# Patient Record
Sex: Female | Born: 1940 | Race: White | Hispanic: No | Marital: Married | Smoking: Never smoker
Health system: Southern US, Community
[De-identification: ages and names within clinical notes are randomized; demographics above are authoritative.]

## PROBLEM LIST (undated history)

## (undated) DIAGNOSIS — M069 Rheumatoid arthritis, unspecified: Secondary | ICD-10-CM

## (undated) DIAGNOSIS — Z7401 Bed confinement status: Secondary | ICD-10-CM

## (undated) HISTORY — PX: ABDOMINAL HYSTERECTOMY: SHX81

## (undated) HISTORY — PX: TONSILLECTOMY: SUR1361

## (undated) HISTORY — PX: BACK SURGERY: SHX140

## (undated) HISTORY — PX: BREAST BIOPSY: SHX20

## (undated) HISTORY — PX: JOINT REPLACEMENT: SHX530

## (undated) HISTORY — PX: OTHER SURGICAL HISTORY: SHX169

## (undated) HISTORY — PX: ORIF FEMORAL NECK FRACTURE W/ DHS: SUR930

## (undated) HISTORY — PX: DG TOES RT FOOT MULTIPLE SPECIF (ARMC HX): HXRAD1007

## (undated) HISTORY — PX: COLONOSCOPY: SHX174

## (undated) HISTORY — PX: MR FOOT LEFT (ARMC HX): HXRAD1768

## (undated) HISTORY — PX: POSTERIOR LAMINECTOMY / DECOMPRESSION CERVICAL SPINE: SUR739

---

## 2003-07-28 ENCOUNTER — Other Ambulatory Visit: Payer: Self-pay

## 2003-12-01 ENCOUNTER — Other Ambulatory Visit: Payer: Self-pay

## 2003-12-23 ENCOUNTER — Inpatient Hospital Stay: Payer: Self-pay | Admitting: General Practice

## 2003-12-26 ENCOUNTER — Encounter: Payer: Self-pay | Admitting: Internal Medicine

## 2003-12-30 ENCOUNTER — Encounter: Payer: Self-pay | Admitting: Internal Medicine

## 2004-01-29 ENCOUNTER — Encounter: Payer: Self-pay | Admitting: Internal Medicine

## 2004-02-29 ENCOUNTER — Encounter: Payer: Self-pay | Admitting: Internal Medicine

## 2004-03-31 ENCOUNTER — Encounter: Payer: Self-pay | Admitting: Internal Medicine

## 2004-04-28 ENCOUNTER — Encounter: Payer: Self-pay | Admitting: Internal Medicine

## 2004-05-29 ENCOUNTER — Encounter: Payer: Self-pay | Admitting: Internal Medicine

## 2004-06-28 ENCOUNTER — Encounter: Payer: Self-pay | Admitting: Internal Medicine

## 2004-07-29 ENCOUNTER — Encounter: Payer: Self-pay | Admitting: Internal Medicine

## 2004-08-28 ENCOUNTER — Encounter: Payer: Self-pay | Admitting: Internal Medicine

## 2004-09-28 ENCOUNTER — Encounter: Payer: Self-pay | Admitting: Internal Medicine

## 2004-10-29 ENCOUNTER — Encounter: Payer: Self-pay | Admitting: Internal Medicine

## 2004-11-18 ENCOUNTER — Ambulatory Visit: Payer: Self-pay | Admitting: General Surgery

## 2004-11-24 ENCOUNTER — Ambulatory Visit: Payer: Self-pay | Admitting: Gastroenterology

## 2004-11-28 ENCOUNTER — Encounter: Payer: Self-pay | Admitting: Internal Medicine

## 2004-12-29 ENCOUNTER — Encounter: Payer: Self-pay | Admitting: Internal Medicine

## 2005-01-04 ENCOUNTER — Other Ambulatory Visit: Payer: Self-pay

## 2005-01-11 ENCOUNTER — Inpatient Hospital Stay: Payer: Self-pay | Admitting: General Practice

## 2005-01-28 ENCOUNTER — Encounter: Payer: Self-pay | Admitting: Internal Medicine

## 2005-02-28 ENCOUNTER — Encounter: Payer: Self-pay | Admitting: Internal Medicine

## 2005-03-31 ENCOUNTER — Encounter: Payer: Self-pay | Admitting: Internal Medicine

## 2005-04-28 ENCOUNTER — Encounter: Payer: Self-pay | Admitting: Internal Medicine

## 2005-05-29 ENCOUNTER — Encounter: Payer: Self-pay | Admitting: Internal Medicine

## 2005-06-28 ENCOUNTER — Encounter: Payer: Self-pay | Admitting: Internal Medicine

## 2005-07-29 ENCOUNTER — Encounter: Payer: Self-pay | Admitting: Internal Medicine

## 2005-08-28 ENCOUNTER — Encounter: Payer: Self-pay | Admitting: Internal Medicine

## 2005-09-28 ENCOUNTER — Encounter: Payer: Self-pay | Admitting: Internal Medicine

## 2005-10-29 ENCOUNTER — Encounter: Payer: Self-pay | Admitting: Internal Medicine

## 2005-11-28 ENCOUNTER — Encounter: Payer: Self-pay | Admitting: Internal Medicine

## 2005-11-29 ENCOUNTER — Ambulatory Visit: Payer: Self-pay | Admitting: General Surgery

## 2005-12-29 ENCOUNTER — Encounter: Payer: Self-pay | Admitting: Internal Medicine

## 2006-05-10 ENCOUNTER — Ambulatory Visit: Payer: Self-pay | Admitting: Podiatry

## 2007-01-02 ENCOUNTER — Ambulatory Visit: Payer: Self-pay | Admitting: General Surgery

## 2007-03-09 ENCOUNTER — Ambulatory Visit: Payer: Self-pay | Admitting: Podiatry

## 2008-02-01 ENCOUNTER — Ambulatory Visit: Payer: Self-pay | Admitting: General Surgery

## 2009-02-02 ENCOUNTER — Ambulatory Visit: Payer: Self-pay | Admitting: Internal Medicine

## 2009-12-02 ENCOUNTER — Ambulatory Visit: Payer: Self-pay | Admitting: Gastroenterology

## 2010-02-03 ENCOUNTER — Ambulatory Visit: Payer: Self-pay | Admitting: Internal Medicine

## 2011-03-29 ENCOUNTER — Ambulatory Visit: Payer: Self-pay | Admitting: Internal Medicine

## 2011-05-31 ENCOUNTER — Encounter: Payer: Self-pay | Admitting: Rheumatology

## 2011-09-02 ENCOUNTER — Other Ambulatory Visit: Payer: Self-pay | Admitting: Rheumatology

## 2011-09-02 LAB — SYNOVIAL CELL COUNT + DIFF, W/ CRYSTALS
Crystals, Joint Fluid: NONE SEEN
Eosinophil: 0 %
Lymphocytes: 11 %
Neutrophils: 53 %
Nucleated Cell Count: 661 /mm3

## 2011-09-06 LAB — BODY FLUID CULTURE

## 2011-10-11 ENCOUNTER — Ambulatory Visit: Payer: Self-pay | Admitting: General Practice

## 2011-10-11 DIAGNOSIS — Z01810 Encounter for preprocedural cardiovascular examination: Secondary | ICD-10-CM

## 2011-10-11 LAB — CBC
HGB: 12.4 g/dL (ref 12.0–16.0)
MCH: 27.8 pg (ref 26.0–34.0)
MCHC: 32.8 g/dL (ref 32.0–36.0)
Platelet: 267 10*3/uL (ref 150–440)
RDW: 15.2 % — ABNORMAL HIGH (ref 11.5–14.5)

## 2011-10-11 LAB — URINALYSIS, COMPLETE
Bilirubin,UR: NEGATIVE
Glucose,UR: NEGATIVE mg/dL (ref 0–75)
Ketone: NEGATIVE
Leukocyte Esterase: NEGATIVE
Specific Gravity: 1.011 (ref 1.003–1.030)
Squamous Epithelial: <1
WBC UR: NONE SEEN /HPF (ref 0–5)

## 2011-10-11 LAB — APTT: Activated PTT: 27.7 secs (ref 23.6–35.9)

## 2011-10-11 LAB — BASIC METABOLIC PANEL
Anion Gap: 7 (ref 7–16)
BUN: 20 mg/dL — ABNORMAL HIGH (ref 7–18)
Calcium, Total: 9.1 mg/dL (ref 8.5–10.1)
Creatinine: 0.84 mg/dL (ref 0.60–1.30)
EGFR (African American): >60
EGFR (Non-African Amer.): >60
Glucose: 89 mg/dL (ref 65–99)
Osmolality: 278 (ref 275–301)
Sodium: 138 mmol/L (ref 136–145)

## 2011-10-11 LAB — MRSA PCR SCREENING

## 2011-10-11 LAB — PROTIME-INR: Prothrombin Time: 11.9 secs (ref 11.5–14.7)

## 2011-10-24 ENCOUNTER — Inpatient Hospital Stay: Payer: Self-pay | Admitting: General Practice

## 2011-10-25 LAB — BASIC METABOLIC PANEL
Anion Gap: 7 (ref 7–16)
BUN: 10 mg/dL (ref 7–18)
Co2: 25 mmol/L (ref 21–32)
Creatinine: 0.6 mg/dL (ref 0.60–1.30)
EGFR (African American): >60
EGFR (Non-African Amer.): >60
Osmolality: 276 (ref 275–301)

## 2011-10-25 LAB — HEMOGLOBIN: HGB: 8.2 g/dL — ABNORMAL LOW (ref 12.0–16.0)

## 2011-10-26 LAB — BASIC METABOLIC PANEL
Anion Gap: 7 (ref 7–16)
BUN: 8 mg/dL (ref 7–18)
Calcium, Total: 7.7 mg/dL — ABNORMAL LOW (ref 8.5–10.1)
Chloride: 109 mmol/L — ABNORMAL HIGH (ref 98–107)
Co2: 25 mmol/L (ref 21–32)
EGFR (African American): >60
Osmolality: 279 (ref 275–301)
Potassium: 3.7 mmol/L (ref 3.5–5.1)

## 2011-10-26 LAB — HEMOGLOBIN: HGB: 7.9 g/dL — ABNORMAL LOW (ref 12.0–16.0)

## 2011-10-26 LAB — PLATELET COUNT: Platelet: 153 10*3/uL (ref 150–440)

## 2011-10-27 LAB — HEMOGLOBIN: HGB: 9.7 g/dL — ABNORMAL LOW (ref 12.0–16.0)

## 2011-10-28 LAB — PATHOLOGY REPORT

## 2012-03-29 ENCOUNTER — Ambulatory Visit: Payer: Self-pay | Admitting: Internal Medicine

## 2013-04-01 ENCOUNTER — Ambulatory Visit: Payer: Self-pay | Admitting: Internal Medicine

## 2013-06-21 ENCOUNTER — Ambulatory Visit: Payer: Self-pay | Admitting: Rheumatology

## 2013-07-01 ENCOUNTER — Encounter: Payer: Self-pay | Admitting: Rheumatology

## 2013-08-16 ENCOUNTER — Ambulatory Visit: Payer: Self-pay | Admitting: Orthopedic Surgery

## 2013-10-21 ENCOUNTER — Encounter: Payer: Self-pay | Admitting: Orthopedic Surgery

## 2013-10-29 ENCOUNTER — Encounter: Payer: Self-pay | Admitting: Orthopedic Surgery

## 2013-11-28 ENCOUNTER — Encounter: Payer: Self-pay | Admitting: Orthopedic Surgery

## 2014-04-03 ENCOUNTER — Ambulatory Visit: Payer: Self-pay | Admitting: Internal Medicine

## 2014-06-17 NOTE — Op Note (Signed)
PATIENT NAME:  Lindsay Arnold, Lindsay Arnold MR#:  [id number] DATE OF BIRTH:  [birth year]  DATE OF PROCEDURE:  10/24/2011  PREOPERATIVE DIAGNOSES:  1. 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  2. Retained hardware of the right hip status post open reduction and internal fixation of right femoral neck fracture.   POSTOPERATIVE DIAGNOSES:  1. 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  2. Retained hardware of the right hip status post open reduction and internal fixation of right femoral neck fracture.   PROCEDURES PERFORMED:  1. Removal of retained hardware from the right hip.  2. Right total hip arthroplasty.   SURGEON: Illene Labrador. Hooten, M.D.   ASSISTANT: Van Clines, PA-C (required to maintain retraction throughout the procedure).   ANESTHESIA: General.   ESTIMATED BLOOD LOSS: 300 mL.  FLUIDS REPLACED: 21,000 mL of crystalloid.  DRAINS: Two medium drains to Hemovac reservoir.   IMPLANTS UTILIZED: DePuy 15 mm small stature Prodigy femoral component, 54 mm outer diameter Pinnacle Sector two acetabular component, two 6.5 mm cancellus bone screws, a +4 mm 10 degree Pinnacle Marathon polyethylene liner, and a -2 mm M-SPEC femoral head with a -2 mm neck length.   INDICATIONS FOR SURGERY: The patient is a 74 year old female with a long history of rheumatoid arthritis who underwent open reduction and internal fixation of right femoral neck fracture several years ago as per Dr. Reita Chard. She had been seen recently with complaints of progressive right groin pain as well as noticeable limb length difference. 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 After a discussion of the risks and benefits of surgical intervention, the patient expressed her understanding of the risks and benefits and agreed with plans for surgical  intervention.   PROCEDURE IN DETAIL: The patient was brought to the Operating Room and, after adequate general anesthesia was achieved, the patient was placed in the left lateral decubitus position. Axillary roll was placed and all bony prominences were well padded. The patient's right hip and leg were cleaned and prepped with alcohol and DuraPrep and draped in the usual sterile fashion. A "time-out" was performed as per usual protocol. A lateral curvilinear incision was made gently curving towards the posterior superior iliac spine. IT band was incised in line with the skin incision and the fibers of the gluteus maximus were split in line. A large bursa was encountered and was incised and drained. Two 7.3 mm cannulated partially threaded cancellus screws were noted and these were removed without difficulty. Below the screws was the compression screw and the sideplate which was visualized. The two 4.5 mm cortical screws from the sideplate were removed and the side plate was removed without difficulty. The compression screw was then engaged with a T handle and was removed without difficulty. The bursa was debrided using electrocautery. The vastus lateralis was repaired using #5 Ethibond. Next, the piriformis tendon was identified, skeletonized, and incised at its insertion to the proximal femur and reflected posteriorly. In a similar fashion, short external rotators were incised and reflected posteriorly. A T-type posterior capsulotomy was performed. The femoral head was then dislocated posteriorly. Severe avascular changes were noted with collapse of the femoral head. The femoral neck cut was performed using an oscillating saw and the anterior capsule was elevated off the femoral neck. Inspection of the acetabulum demonstrated severe degenerative changes. Remnant of the labrum was excised. The acetabulum was reamed in a  sequential fashion up to a 53 mm diameter. Good punctate bleeding bone was encountered. A 54 mm  outer diameter Pinnacle Sector 2 acetabular component was positioned and impacted into place. Good scratch fit was appreciated. Two 6.5 mm cancellus screws were inserted through the shell for additional fixation. A +4 mm neutral polyethylene trial was inserted and attention was directed to the proximal femur. Pilot hole for reaming of the proximal femoral canal was created using a high-speed bur. Proximal femoral canal was reamed in a sequential fashion up to a 14.5 mm diameter. Proximal femur was prepared using a 50 mm aggressive side-biting reamer. Serial broaches were inserted up to a 15 mm small stature broach. The calcar region was planed accordingly and trial reduction was performed using first a 1.5 and subsequently a -2 mm hip ball. The hip was felt to be tight. The 15 mm small stature broach was countersunk and additional bone was removed from the calcar region using calcar planer. Trial reduction was again performed using first an AML neck length with +4 mm neutral. Subsequently, a +4 mm 10 degree trial liner was placed with the high side at approximately the eight o'clock position. Improved offset was appreciated with Prodigy neck segment. Excellent stability was appreciated both anteriorly and posteriorly. Good equalization of limb lengths was noted. Trial components were removed. The acetabular shell was irrigated with copious amounts of normal saline with antibiotic solution. A +4 mm 10 degree Pinnacle Marathon polyethylene liner was positioned with the high side at eight o'clock position and impacted in place. Next, a 15 mm small stature Prodigy femoral component was positioned and impacted into place. Excellent scratch fit was appreciated. Trial reduction was again performed with the -2 mm neck length. Excellent stability was appreciated both anteriorly and posteriorly. Trial hip ball was removed. The Morse taper was cleaned and dried. A 36 mm M-SPEC femoral component with a -2 mm neck length was  placed on the trunnion and impacted into place. The hip was reduced and placed through a range of motion. Excellent stability was appreciated and good equalization of limb lengths noted. There did not appear to be undue tension on the sciatic nerve. The wound was irrigated with copious amounts of normal saline with antibiotic solution using pulsatile lavage and then suctioned dry. Good hemostasis was appreciated. The posterior capsulotomy was repaired using #5 Ethibond. The piriformis tendon was reapproximated on the undersurface of the gluteus medius tendon using #5 Ethibond. Two medium drains were placed in the wound bed and brought out through a separate stab incision to be attached to a Hemovac reservoir. IT band was repaired using interrupted sutures of #1 Vicryl. The subcutaneous tissue was approximated in layers using first #0 Vicryl followed 2-0 Vicryl. Skin was closed with skin staples. A sterile dressing was applied. The patient tolerated the procedure well. She was transported to the recovery room in stable condition.    ____________________________ Illene LabradorJames P. Angie FavaHooten Jr., MD jph:ap D: 10/24/2011 16:24:37 ET T: 10/24/2011 17:26:31 ET JOB#: [id number]  cc: Illene LabradorJames P. Angie FavaHooten Jr., MD, <Dictator> JAMES P Angie FavaHOOTEN JR MD ELECTRONICALLY SIGNED 10/25/2011 7:15

## 2014-06-17 NOTE — Discharge Summary (Signed)
PATIENT NAME:  Lindsay Arnold, Lindsay Arnold MR#:  [id number] DATE OF BIRTH:  [birth year]  DATE OF ADMISSION:  10/24/2011 DATE OF DISCHARGE:  10/27/2011  ADMITTING DIAGNOSIS: Posttraumatic degenerative arthrosis of right hip superimposed on rheumatoid arthritis.   DISCHARGE DIAGNOSES:  1. 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 2. Retained hardware of right hip status post open reduction internal fixation of right femoral neck fracture.   PROCEDURES:  1. Removal of retained hardware from the right hip.  2. Right total hip arthroplasty.   SURGEON: Illene Labrador. Hooten, MD   ASSISTANT: Hamilton Capri PA-C    ANESTHESIA: General.   ESTIMATED BLOOD LOSS: 300 mL.   FLUIDS REPLACED: 2100 mL of Crystalloid.   DRAINS: Two medium drains to Hemovac reservoir.  IMPLANTS UTILIZED: DePuy 15 mm small stature Prodigy femoral component, 54 mm outer diameter Pinnacle sector II acetabular component, two 6.5 mm cancellous bone screws, a +4 mm 10 degree Pinnacle Marathon polyethylene liner, and a -2 mm M-Spec femoral head with a 2 mm neck length.   HISTORY: The patient is 74 year old female with a long history of rheumatoid arthritis who underwent open reduction internal fixation of the right femoral neck fracture several years ago as per Dr. Myra Rude. She had been seen recently with complaints of progressive right groin pain as well as noticeable limb length difference. 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 After discussion of the risks and benefits of surgical intervention, the patient expressed her understanding of the risks and benefits and agreed with plans for surgical intervention.   PHYSICAL EXAMINATION: GENERAL: Well developed, well nourished female seen in no acute distress. Antalgic gait, abductor lurch. HEENT: Atraumatic, normocephalic. Pupils equal and reactive  to light. Extraocular motions intact. Sclerae clear. Oropharynx is clear. Moist mucosa. NECK: Supple, nontender with good range of motion. No thyromegaly. No adenopathy. No JVD. No carotid bruits. LUNGS: Clear to auscultation bilaterally. CARDIOVASCULAR: Regular rate and rhythm. Normal S1, S2. No murmur. No appreciable gallops or rubs. Peripheral pulses are palpable. No lower extremity edema. Homans test is negative. ABDOMEN: Soft, nontender, nondistended. Bowel sounds are present. SPINE: Fair range of motion with both flexion and extension. Sacroiliac joints are nontender. No paraspinous tenderness. Flip test is negative. EXTREMITIES: Deformities and nodules to the hands consistent with rheumatoid disease. RIGHT HIP: Positive for pelvic tilt and significant shortening of the right lower extremity. She has crepitus range of motion of the hip. She is tender along the greater trochanter and pain elicited by axial compression or extreme rotation. Atrophy negative. She does have weak hip flexion. NEUROLOGIC: Awake, alert, and oriented. Sensory function is intact to pin prick and light touch. Motor strength is judged to be 5 out of 5 except as noted above. Motor coordination is grossly within normal limits. No apparent clonus. No tremor.   HOSPITAL COURSE: The patient was admitted to the hospital 10/24/2011. She had surgery that same day and was brought to the orthopedic floor from the PAC-U unit in stable condition. On August 27th hemoglobin dropped to 8.2 and then on postop day two on August 28th it dropped to 7.9. On 10/26/2011 the patient was given 1 unit of packed red blood cells. She had progressed well with physical therapy. On postop day three on 10/27/2011 hemoglobin was stable at 9.7. The patient's vital signs were stable and the patient had progressed well with physical therapy. The patient was ready for  discharge home with home health.   DISCHARGE INSTRUCTIONS:  1. Right total hip replacement. The  patient may gradually increase weightbearing on the affected extremity. Knee-high TED hose on both legs and remove at bedtime. Replace when arising the next morning.  2. Diet. She may resume a regular diet as tolerated.  3. Medications. Her medications consist of Lovenox 40 mg subcutaneous once a day, pain meds with Ultram 1 to 2 tablets every six hours needed for pain and oxycodone 5 to 10 mg every four hours as needed for pain.  4. Wound care. Apply an ice pack to the affected area. Do not get the dressing or bandage wet or dirty. [facility] office if the dressing gets water under it. Leave the dressing on.  5. Symptoms to report. [facility] if any of the following occur: Bright red bleeding from the incision or wound, fever above 101.5 degrees, redness, swelling, or drainage at the incision. Call [facility] if you experience increased leg pain, numbness or weakness in the legs or bowel or bladder symptoms.  6. Referral. Home physical therapy has been arranged for continuation of rehab program. Call [facility] if a therapist has not contacted you within 48 hours.  7. Follow-up. Follow-up appointment 11/29/2011 at 11 a.m.   DISCHARGE MEDICATIONS:  1. Lutein capsule 6 mg capsule 1 capsule orally once a day at lunch.  2. Ascorbic acid tablet 500 mg 1 tablet oral b.i.d.  3. Multivitamin capsule multiple vitamins one cap orally once a day at lunch. 4. Levo-T 50 mcg 0.05 mg oral tablet one orally once a day in the morning.  5. Calcium 600 mg Plus D 1 tablet oral b.i.d. with lunch and dinner. 6. Tylenol 500 mg 2 tabs orally p.r.n. 7. GenTeal one drop to each affected eye p.r.n.  8. Leflunomide 20 mg oral tablet 1 tablet orally once a day in the morning.  9. Clindamycin 300 mg two capsules one hour prior to dental appointment. 10. Prilosec OTC 20 mg oral delayed release tablet 1 tablet orally once a day in the morning.  11. Sudafed 30 mg oral tablet 1 tablet orally  every six hours p.r.n.   ____________________________ Evon Slackhomas C. Guy Seese, PA-C tcg:drc D: 11/07/2011 14:22:59 ET T: 11/08/2011 12:15:35 ET JOB#: [id number]  cc: Evon Slackhomas C. Johnmark Geiger, PA-C, <Dictator> Evon SlackHOMAS C Jacklyn Branan PA ELECTRONICALLY SIGNED 11/29/2011 13:48

## 2015-03-18 ENCOUNTER — Other Ambulatory Visit: Payer: Self-pay | Admitting: Internal Medicine

## 2015-03-18 DIAGNOSIS — Z1231 Encounter for screening mammogram for malignant neoplasm of breast: Secondary | ICD-10-CM

## 2015-04-06 ENCOUNTER — Other Ambulatory Visit: Payer: Self-pay | Admitting: Internal Medicine

## 2015-04-06 ENCOUNTER — Ambulatory Visit
Admission: RE | Admit: 2015-04-06 | Discharge: 2015-04-06 | Disposition: A | Discharge status: Home / Self Care | Payer: Medicare Other | Source: Ambulatory Visit | Attending: Internal Medicine | Admitting: Internal Medicine

## 2015-04-06 DIAGNOSIS — Z1231 Encounter for screening mammogram for malignant neoplasm of breast: Secondary | ICD-10-CM

## 2015-04-22 ENCOUNTER — Encounter: Payer: Self-pay | Admitting: *Deleted

## 2015-04-23 ENCOUNTER — Ambulatory Visit: Payer: Medicare Other | Admitting: Anesthesiology

## 2015-04-23 ENCOUNTER — Encounter
Admission: RE | Disposition: A | Discharge status: Home / Self Care | Payer: Self-pay | Source: Ambulatory Visit | Attending: Gastroenterology

## 2015-04-23 ENCOUNTER — Ambulatory Visit
Admission: RE | Admit: 2015-04-23 | Discharge: 2015-04-23 | Disposition: A | Discharge status: Home / Self Care | Payer: Medicare Other | Source: Ambulatory Visit | Attending: Gastroenterology | Admitting: Gastroenterology

## 2015-04-23 DIAGNOSIS — Z8371 Family history of colonic polyps: Secondary | ICD-10-CM | POA: Diagnosis not present

## 2015-04-23 DIAGNOSIS — Z1211 Encounter for screening for malignant neoplasm of colon: Secondary | ICD-10-CM | POA: Diagnosis not present

## 2015-04-23 DIAGNOSIS — K5730 Diverticulosis of large intestine without perforation or abscess without bleeding: Secondary | ICD-10-CM | POA: Insufficient documentation

## 2015-04-23 DIAGNOSIS — Z79899 Other long term (current) drug therapy: Secondary | ICD-10-CM | POA: Insufficient documentation

## 2015-04-23 DIAGNOSIS — Z7982 Long term (current) use of aspirin: Secondary | ICD-10-CM | POA: Insufficient documentation

## 2015-04-23 HISTORY — PX: COLONOSCOPY WITH PROPOFOL: SHX5780

## 2015-04-23 SURGERY — COLONOSCOPY WITH PROPOFOL
Anesthesia: General

## 2015-04-23 MED ORDER — PROPOFOL 10 MG/ML IV BOLUS
INTRAVENOUS | Status: DC | PRN
  Administered 2015-04-23: 50 mg via INTRAVENOUS

## 2015-04-23 MED ORDER — SODIUM CHLORIDE 0.9 % IV SOLN
INTRAVENOUS | Status: DC
  Administered 2015-04-23: 11:00:00 via INTRAVENOUS

## 2015-04-23 MED ORDER — SODIUM CHLORIDE 0.9 % IV SOLN: INTRAVENOUS | Status: DC

## 2015-04-23 MED ORDER — PROPOFOL 500 MG/50ML IV EMUL
INTRAVENOUS | Status: DC | PRN
  Administered 2015-04-23: 100 ug/kg/min via INTRAVENOUS

## 2015-04-23 NOTE — Op Note (Signed)
[facility] Patient Name: Lindsay Arnold Procedure Date: 04/23/2015 10:46 AM MRN: [id number] Account #: [id number] Date of Birth: 07/12/1940 Admit Type: Outpatient Age: 75 Room: [facility] ENDO ROOM 4 Gender: Female Note Status: Finalized Procedure:            Colonoscopy Indications:          Colon cancer screening in patient at increased risk:                        Family history of 1st-degree relative with colon polyps Providers:            Ezzard Standing. Bluford Kaufmann, MD Referring MD:         Daniel Nones, MD (Referring MD) Medicines:            Monitored Anesthesia Care Complications:        No immediate complications. Procedure:            Pre-Anesthesia Assessment:                       - Prior to the procedure, a History and Physical was                        performed, and patient medications, allergies and                        sensitivities were reviewed. The patient's tolerance of                        previous anesthesia was reviewed.                       - The risks and benefits of the procedure and the                        sedation options and risks were discussed with the                        patient. All questions were answered and informed                        consent was obtained.                       - After reviewing the risks and benefits, the patient                        was deemed in satisfactory condition to undergo the                        procedure.                       After obtaining informed consent, the colonoscope was                        passed under direct vision. Throughout the procedure,                        the patient's blood pressure, pulse, and oxygen  saturations were monitored continuously. The                        Colonoscope was introduced through the anus and                        advanced to the the cecum, identified by appendiceal                        orifice and ileocecal valve.  The colonoscopy was                        performed without difficulty. The patient tolerated the                        procedure well. The quality of the bowel preparation                        was fair. Findings:      Multiple small and large-mouthed diverticula were found in the sigmoid       colon.      The exam was otherwise without abnormality. Impression:           - Preparation of the colon was fair.                       - Diverticulosis in the sigmoid colon.                       - The examination was otherwise normal.                       - No specimens collected. Recommendation:       - Discharge patient to home.                       - The findings and recommendations were discussed with                        the patient. Procedure Code(s):    --- Professional ---                       [phone], Colonoscopy, flexible; diagnostic, including                        collection of specimen(s) by brushing or washing, when                        performed (separate procedure) Diagnosis Code(s):    --- Professional ---                       Z83.71, Family history of colonic polyps                       K57.30, Diverticulosis of large intestine without                        perforation or abscess without bleeding CPT copyright 2016 American [employer] Association. All rights reserved. The codes documented in this report are preliminary and upon coder review may  be revised to meet current compliance requirements. Ezzard Standing  Bluford Kaufmann, MD 04/23/2015 11:02:56 AM This report has been signed electronically. Number of Addenda: 0 Note Initiated On: 04/23/2015 10:46 AM Scope Withdrawal Time: 0 hours 5 minutes 12 seconds  Total Procedure Duration: 0 hours 9 minutes 27 seconds       [facility]

## 2015-04-23 NOTE — Transfer of Care (Signed)
Immediate Anesthesia Transfer of Care Note  Patient: Lindsay Arnold  Procedure(s) Performed: Procedure(s): COLONOSCOPY WITH PROPOFOL (N/A)  Patient Location: PACU  Anesthesia Type:General  Level of Consciousness: awake, alert  and oriented  Airway & Oxygen Therapy: Patient Spontanous Breathing and Patient connected to nasal cannula oxygen  Post-op Assessment: Report given to RN and Post -op Vital signs reviewed and stable  Post vital signs: Reviewed and stable  Last Vitals:  Filed Vitals:   04/23/15 1023  BP: 159/86  Pulse: 112  Temp: 36 C  Resp: 18    Complications: No apparent anesthesia complications

## 2015-04-23 NOTE — H&P (Signed)
Primary Care Physician:  Lynnea Ferrier, MD Primary Gastroenterologist:  Dr. Bluford Kaufmann  Pre-Procedure History & Physical: HPI:  Lindsay Arnold is a 75 y.o. female is here for an colonoscopy   No past medical history on file.  Past Surgical History  Procedure Laterality Date  . Breast biopsy Left   . Back surgery    . Abdominal hysterectomy    . Mr foot left ([facility] hx)    . Dg toes rt foot multiple specif ([facility] hx) Right     reconstruction  . Tonsillectomy    . Joint replacement    . Ltk rtk rth  Bilateral   . Posterior laminectomy / decompression cervical spine    . Orif femoral neck fracture w/ dhs    . Colonoscopy      Prior to Admission medications   Medication Sig Start Date End Date Taking? Authorizing Provider  acetaminophen (TYLENOL) 325 MG tablet Take 650 mg by mouth every 6 (six) hours as needed.   Yes Historical Provider, MD  aspirin 81 MG tablet Take 81 mg by mouth daily.   Yes Historical Provider, MD  azelastine (ASTELIN) 0.1 % nasal spray Place into both nostrils 2 (two) times daily. Use in each nostril as directed   Yes Historical Provider, MD  calcium-vitamin D (OSCAL WITH D) 500-200 MG-UNIT tablet Take 1 tablet by mouth.   Yes Historical Provider, MD  Clindamycin HCl (CLINDAMYCIN MONOHYDRATE) powder Apply topically.   Yes Historical Provider, MD  hydroxypropyl methylcellulose / hypromellose (ISOPTO TEARS / GONIOVISC) 2.5 % ophthalmic solution 1 drop.   Yes Historical Provider, MD  ipratropium-albuterol (DUONEB) 0.5-2.5 (3) MG/3ML SOLN Take 3 mLs by nebulization every 6 (six) hours as needed.   Yes Historical Provider, MD  leflunomide (ARAVA) 10 MG tablet Take 10 mg by mouth daily.   Yes Historical Provider, MD  levothyroxine (SYNTHROID, LEVOTHROID) 50 MCG tablet Take 50 mcg by mouth daily before breakfast.   Yes Historical Provider, MD  meloxicam (MOBIC) 7.5 MG tablet Take 7.5 mg by mouth daily.   Yes Historical Provider, MD  multivitamin-lutein (OCUVITE-LUTEIN)  CAPS capsule Take 1 capsule by mouth daily.   Yes Historical Provider, MD  omeprazole (PRILOSEC) 10 MG capsule Take 10 mg by mouth daily.   Yes Historical Provider, MD  vitamin C (ASCORBIC ACID) 500 MG tablet Take 500 mg by mouth daily.   Yes Historical Provider, MD  Abatacept (ORENCIA) 125 MG/ML SOSY Inject into the skin. Reported on 04/23/2015    Historical Provider, MD    Allergies as of 04/17/2015  . (Not on File)    Family History  Problem Relation Age of Onset  . Breast cancer Paternal Grandmother     Social History   Social History  . Marital Status: Married    Spouse Name: N/A  . Number of Children: N/A  . Years of Education: N/A   Occupational History  . Not on file.   Social History Main Topics  . Smoking status: Not on file  . Smokeless tobacco: Not on file  . Alcohol Use: Not on file  . Drug Use: Not on file  . Sexual Activity: Not on file   Other Topics Concern  . Not on file   Social History Narrative    Review of Systems: See HPI, otherwise negative ROS  Physical Exam: BP 159/86 mmHg  Pulse 112  Temp(Src) 96.8 F (36 C) (Tympanic)  Resp 18  Ht  (1.651 m)  Wt  66.679 kg (147 lb)  BMI 24.46 kg/m2  SpO2 100% General:   Alert,  pleasant and cooperative in NAD Head:  Normocephalic and atraumatic. Neck:  Supple; no masses or thyromegaly. Lungs:  Clear throughout to auscultation.    Heart:  Regular rate and rhythm. Abdomen:  Soft, nontender and nondistended. Normal bowel sounds, without guarding, and without rebound.   Neurologic:  Alert and  oriented x4;  grossly normal neurologically.  Impression/Plan: Lindsay Arnold is here for an colonoscopy to be performed for screening, family hx of colon polyps  Risks, benefits, limitations, and alternatives regarding  colonoscopy} have been reviewed with the patient.  Questions have been answered.  All parties agreeable.   Lance [place], Ezzard Standing, MD  04/23/2015, 10:45 AM

## 2015-04-23 NOTE — Anesthesia Postprocedure Evaluation (Signed)
Anesthesia Post Note  Patient: Lindsay Arnold  Procedure(s) Performed: Procedure(s) (LRB): COLONOSCOPY WITH PROPOFOL (N/A)  Patient location during evaluation: PACU Anesthesia Type: General Level of consciousness: awake Pain management: pain level controlled Vital Signs Assessment: post-procedure vital signs reviewed and stable Respiratory status: spontaneous breathing Cardiovascular status: stable Anesthetic complications: no    Last Vitals:  Filed Vitals:   04/23/15 1110 04/23/15 1120  BP: 111/62 138/71  Pulse:  77  Temp:    Resp:  16    Last Pain: There were no vitals filed for this visit.               VAN STAVEREN,Jolynne Spurgin

## 2015-04-23 NOTE — Anesthesia Preprocedure Evaluation (Signed)
Anesthesia Evaluation  Patient identified by MRN, date of birth, ID band Patient awake    Reviewed: Allergy & Precautions, NPO status , Patient's Chart, lab work & pertinent test results  History of Anesthesia Complications Negative for: history of anesthetic complications  Airway Mallampati: II       Dental no notable dental hx.    Pulmonary neg pulmonary ROS,    Pulmonary exam normal        Cardiovascular Exercise Tolerance: Good  Rhythm:Regular Rate:Normal     Neuro/Psych negative neurological ROS     GI/Hepatic negative GI ROS, Neg liver ROS,   Endo/Other  negative endocrine ROS  Renal/GU negative Renal ROS     Musculoskeletal   Abdominal   Peds negative pediatric ROS (+)  Hematology negative hematology ROS (+)   Anesthesia Other Findings   Reproductive/Obstetrics                             Anesthesia Physical Anesthesia Plan  ASA: II  Anesthesia Plan: General   Post-op Pain Management:    Induction: Intravenous  Airway Management Planned: Natural Airway and Nasal Cannula  Additional Equipment:   Intra-op Plan:   Post-operative Plan:   Informed Consent: I have reviewed the patients History and Physical, chart, labs and discussed the procedure including the risks, benefits and alternatives for the proposed anesthesia with the patient or authorized representative who has indicated his/her understanding and acceptance.     Plan Discussed with: CRNA  Anesthesia Plan Comments:         Anesthesia Quick Evaluation

## 2015-04-28 ENCOUNTER — Encounter: Payer: Self-pay | Admitting: Gastroenterology

## 2016-03-25 ENCOUNTER — Other Ambulatory Visit: Payer: Self-pay | Admitting: Internal Medicine

## 2016-03-25 DIAGNOSIS — Z1231 Encounter for screening mammogram for malignant neoplasm of breast: Secondary | ICD-10-CM

## 2016-04-25 ENCOUNTER — Ambulatory Visit
Admission: RE | Admit: 2016-04-25 | Discharge: 2016-04-25 | Disposition: A | Discharge status: Home / Self Care | Payer: Medicare Other | Source: Ambulatory Visit | Attending: Internal Medicine | Admitting: Internal Medicine

## 2016-04-25 DIAGNOSIS — Z1231 Encounter for screening mammogram for malignant neoplasm of breast: Secondary | ICD-10-CM | POA: Insufficient documentation

## 2016-04-27 ENCOUNTER — Other Ambulatory Visit: Payer: Self-pay | Admitting: Internal Medicine

## 2016-04-27 DIAGNOSIS — R928 Other abnormal and inconclusive findings on diagnostic imaging of breast: Secondary | ICD-10-CM

## 2016-05-10 ENCOUNTER — Other Ambulatory Visit: Payer: PRIVATE HEALTH INSURANCE

## 2016-05-10 ENCOUNTER — Ambulatory Visit: Payer: PRIVATE HEALTH INSURANCE

## 2016-05-18 ENCOUNTER — Ambulatory Visit
Admission: RE | Admit: 2016-05-18 | Discharge: 2016-05-18 | Disposition: A | Discharge status: Home / Self Care | Payer: Medicare Other | Source: Ambulatory Visit | Attending: Internal Medicine | Admitting: Internal Medicine

## 2016-05-18 DIAGNOSIS — R928 Other abnormal and inconclusive findings on diagnostic imaging of breast: Secondary | ICD-10-CM | POA: Diagnosis not present

## 2017-04-18 ENCOUNTER — Other Ambulatory Visit: Payer: Self-pay | Admitting: Internal Medicine

## 2017-04-18 DIAGNOSIS — Z1231 Encounter for screening mammogram for malignant neoplasm of breast: Secondary | ICD-10-CM

## 2017-05-01 ENCOUNTER — Ambulatory Visit
Admission: RE | Admit: 2017-05-01 | Discharge: 2017-05-01 | Disposition: A | Discharge status: Home / Self Care | Payer: Medicare Other | Source: Ambulatory Visit | Attending: Internal Medicine | Admitting: Internal Medicine

## 2017-05-01 DIAGNOSIS — Z1231 Encounter for screening mammogram for malignant neoplasm of breast: Secondary | ICD-10-CM

## 2017-08-18 IMAGING — MG MM DIGITAL DIAGNOSTIC UNILAT*L* W/ TOMO W/ CAD
8 series · 8 of 12 positions shown · non-contrast
Comparison: Previous exam(s).

CLINICAL DATA: 76-year-old female presenting for evaluation of
distortion in the lateral left breast. The patient states she had an
excisional biopsy some time in [REDACTED] in the lateral aspect of
the left breast.

EXAM:
2D DIGITAL DIAGNOSTIC UNILATERAL LEFT MAMMOGRAM WITH CAD AND ADJUNCT
TOMO

[L CC (1 of 2)]
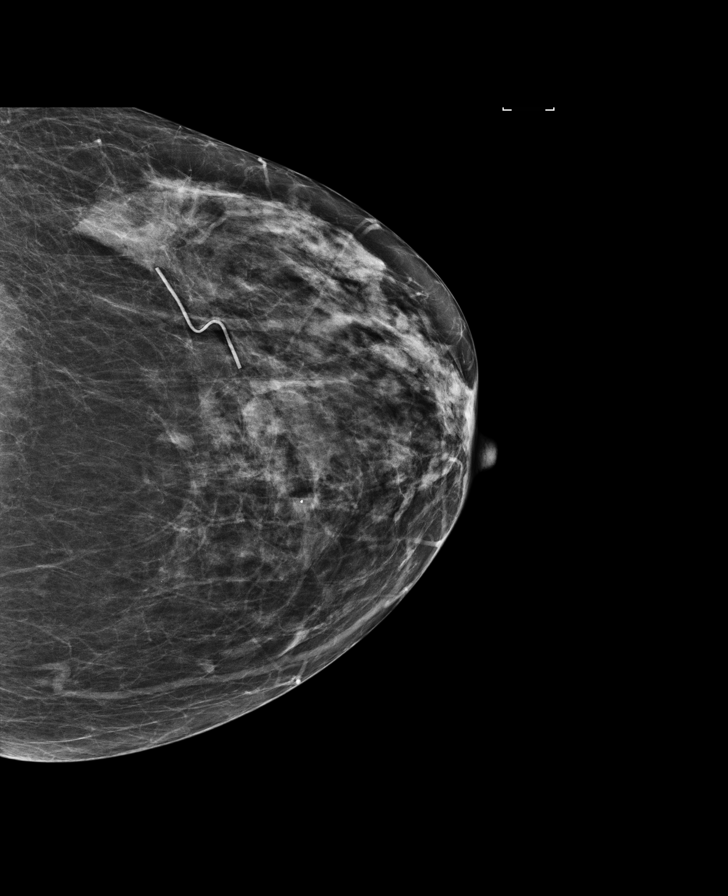

[L MLO (1 of 3)]
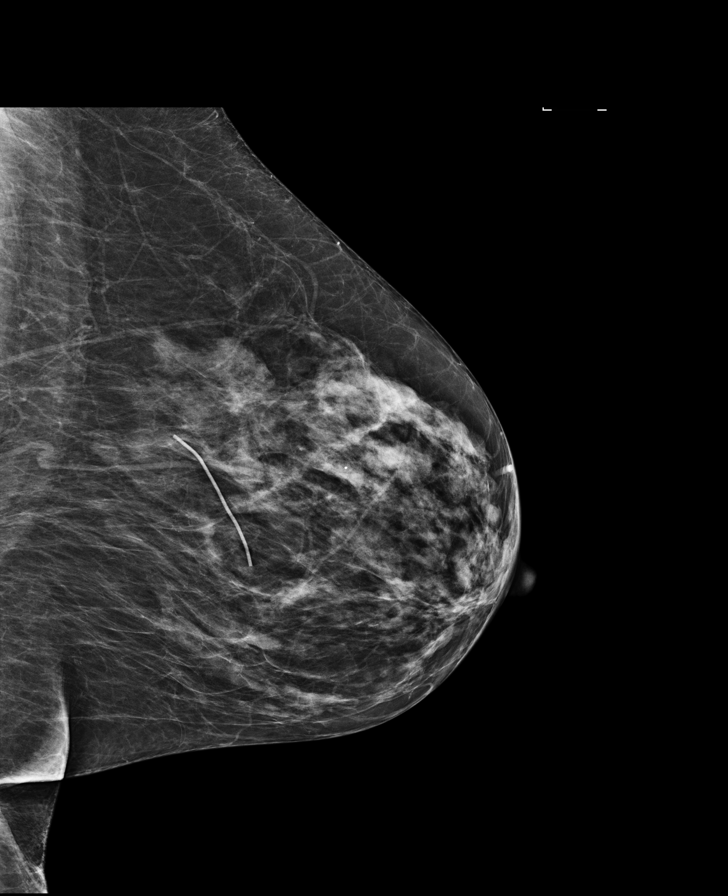

[L MLO (2 of 3)]
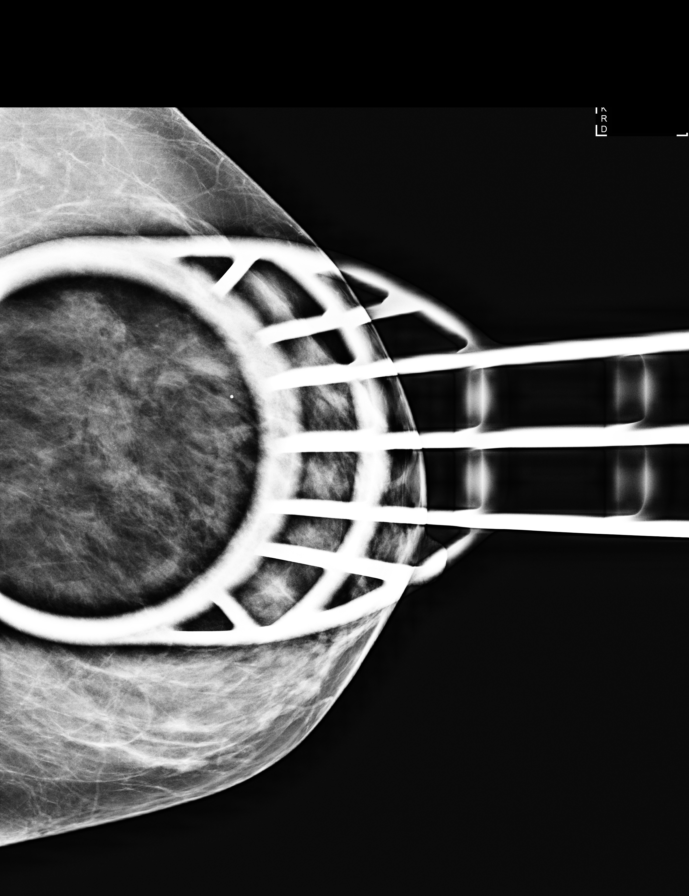

[L CC (2 of 2)]
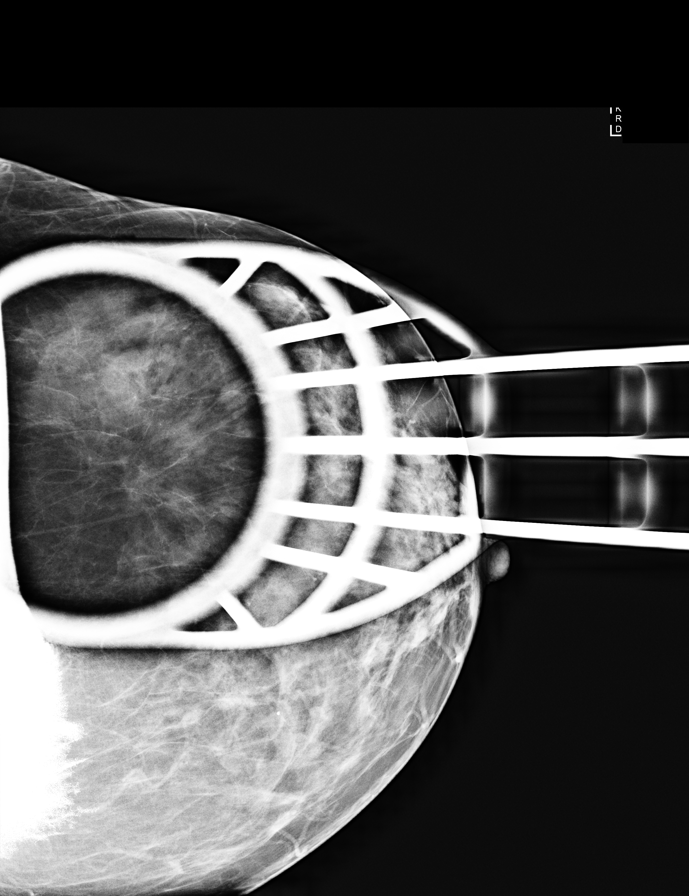

[L CC synth-2D]
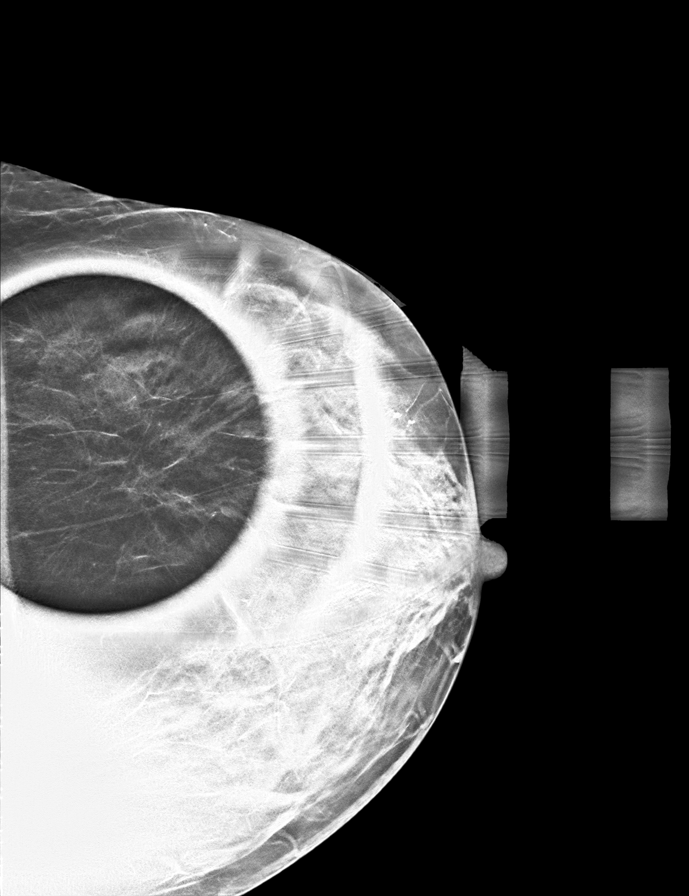

[L MLO synth-2D]
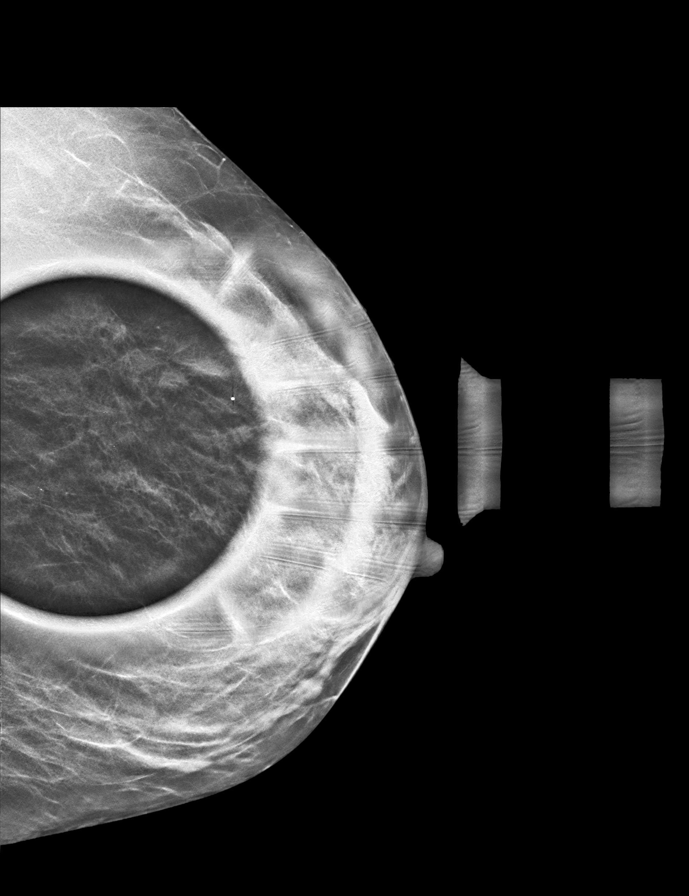

[L MLO tomo · tomo slice 19/37.0]
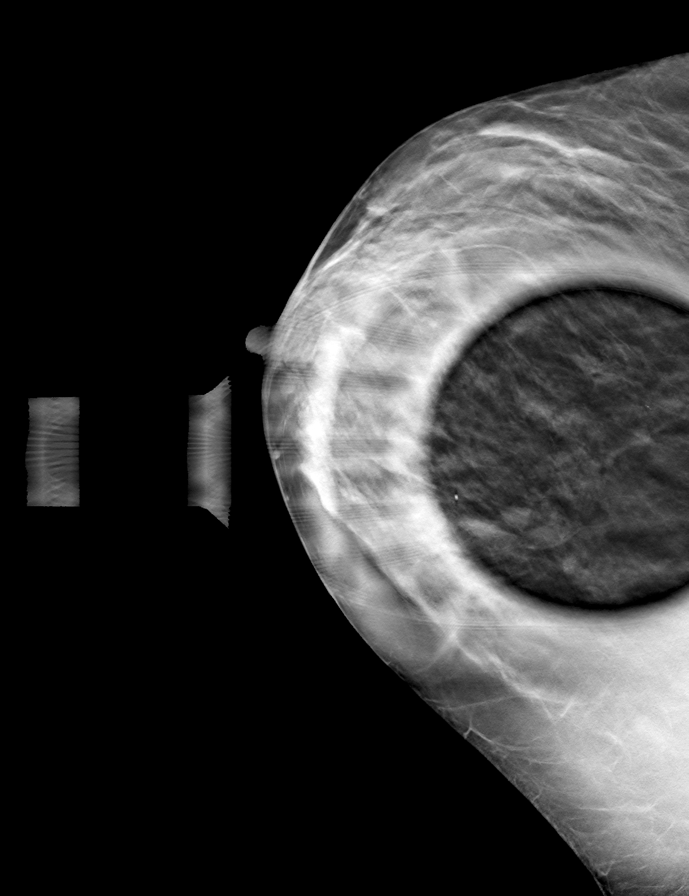

[L MLO (3 of 3)]
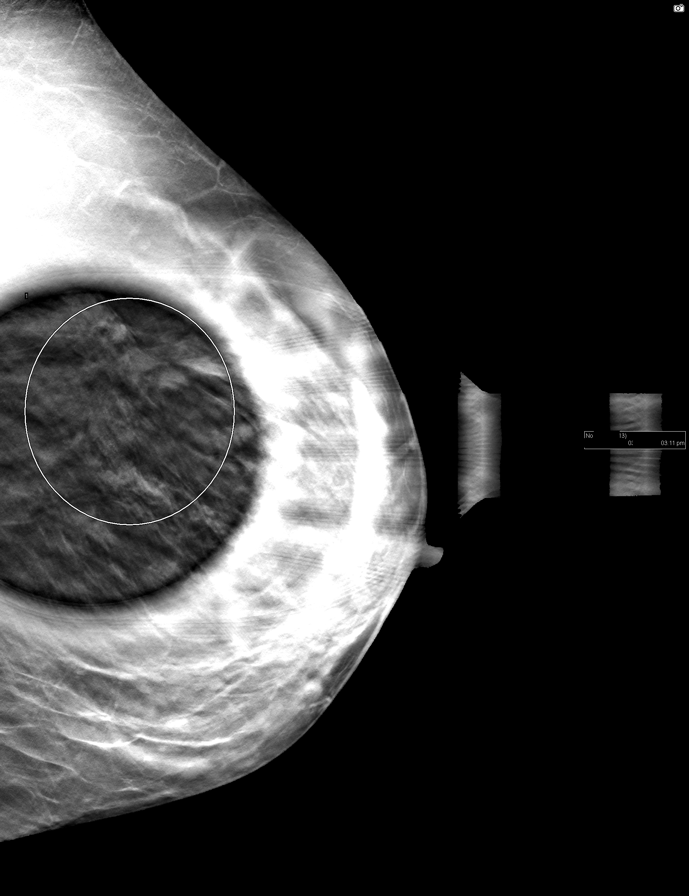

[8 of 12 positions shown; findings below may reference images not displayed]

ACR Breast Density Category c: The breast tissue is heterogeneously
dense, which may obscure small masses.
FINDINGS: There is persistent distortion identified in the lateral left breast
on spot compression tomosynthesis imaging. In comparison with the
June 2011 exam, the area is deep to a scar marker. Today, additional
images were obtained following placement of a scar marker, however a
the location of the scar was estimated by the patient as the
technologist could not visualize the scar. Overall, the appearance
of the tissue has not significantly changed since the 2438 exam.

Mammographic images were processed with CAD.

Physical exam of the lateral left breast demonstrates no discrete
palpable masses. There is a very faint thin linear white
discoloration along the lateral aspect of the left breast, likely
representing the scar from prior surgery.

Ultrasound targeted to the lateral left breast demonstrates no
suspicious masses or areas of shadowing.
IMPRESSION: The distortion in the lateral left breast is deep to an incision
site from a prior excisional biopsy, and is therefore benign.

RECOMMENDATION:
Screening mammogram in one year.(Code:[id number]-B-Q6L)

I have discussed the findings and recommendations with the patient.
Results were also provided in writing at the conclusion of the
visit. If applicable, a reminder letter will be sent to the patient
regarding the next appointment.

BI-RADS CATEGORY  2: Benign.

## 2018-03-27 ENCOUNTER — Other Ambulatory Visit: Payer: Self-pay | Admitting: Internal Medicine

## 2018-03-27 DIAGNOSIS — Z1231 Encounter for screening mammogram for malignant neoplasm of breast: Secondary | ICD-10-CM

## 2018-05-04 ENCOUNTER — Ambulatory Visit
Admission: RE | Admit: 2018-05-04 | Discharge: 2018-05-04 | Disposition: A | Discharge status: Home / Self Care | Payer: Medicare Other | Source: Ambulatory Visit | Attending: Internal Medicine | Admitting: Internal Medicine

## 2018-05-04 DIAGNOSIS — Z1231 Encounter for screening mammogram for malignant neoplasm of breast: Secondary | ICD-10-CM | POA: Diagnosis not present

## 2019-06-05 ENCOUNTER — Other Ambulatory Visit: Payer: Self-pay | Admitting: Internal Medicine

## 2019-06-05 DIAGNOSIS — Z1231 Encounter for screening mammogram for malignant neoplasm of breast: Secondary | ICD-10-CM

## 2019-07-05 ENCOUNTER — Ambulatory Visit
Admission: RE | Admit: 2019-07-05 | Discharge: 2019-07-05 | Disposition: A | Discharge status: Home / Self Care | Payer: Medicare Other | Source: Ambulatory Visit | Attending: Internal Medicine | Admitting: Internal Medicine

## 2019-07-05 DIAGNOSIS — Z1231 Encounter for screening mammogram for malignant neoplasm of breast: Secondary | ICD-10-CM | POA: Insufficient documentation

## 2020-07-01 ENCOUNTER — Other Ambulatory Visit: Payer: Self-pay | Admitting: Internal Medicine

## 2020-07-01 DIAGNOSIS — Z1231 Encounter for screening mammogram for malignant neoplasm of breast: Secondary | ICD-10-CM

## 2020-07-21 ENCOUNTER — Other Ambulatory Visit: Payer: Self-pay

## 2020-07-21 ENCOUNTER — Ambulatory Visit
Admission: RE | Admit: 2020-07-21 | Discharge: 2020-07-21 | Disposition: A | Discharge status: Home / Self Care | Payer: Medicare Other | Source: Ambulatory Visit | Attending: Internal Medicine | Admitting: Internal Medicine

## 2020-07-21 DIAGNOSIS — Z1231 Encounter for screening mammogram for malignant neoplasm of breast: Secondary | ICD-10-CM | POA: Diagnosis present

## 2021-12-03 ENCOUNTER — Other Ambulatory Visit: Payer: Self-pay | Admitting: Internal Medicine

## 2021-12-03 DIAGNOSIS — Z1231 Encounter for screening mammogram for malignant neoplasm of breast: Secondary | ICD-10-CM

## 2021-12-31 ENCOUNTER — Ambulatory Visit
Admission: RE | Admit: 2021-12-31 | Discharge: 2021-12-31 | Disposition: A | Discharge status: Home / Self Care | Payer: Medicare Other | Source: Ambulatory Visit | Attending: Internal Medicine | Admitting: Internal Medicine

## 2021-12-31 DIAGNOSIS — Z1231 Encounter for screening mammogram for malignant neoplasm of breast: Secondary | ICD-10-CM | POA: Diagnosis present

## 2023-01-13 ENCOUNTER — Inpatient Hospital Stay: Payer: Medicare Other

## 2023-01-13 ENCOUNTER — Encounter: Payer: Self-pay | Admitting: Internal Medicine

## 2023-01-13 ENCOUNTER — Inpatient Hospital Stay
Admission: EM | Admit: 2023-01-13 | Discharge: 2023-01-23 | DRG: 287 | Disposition: A | Discharge status: Skilled Nursing Facility | Payer: Medicare Other | Source: Ambulatory Visit | Attending: Internal Medicine | Admitting: Internal Medicine

## 2023-01-13 ENCOUNTER — Other Ambulatory Visit: Payer: Self-pay

## 2023-01-13 ENCOUNTER — Emergency Department: Payer: Medicare Other

## 2023-01-13 DIAGNOSIS — R9431 Abnormal electrocardiogram [ECG] [EKG]: Secondary | ICD-10-CM | POA: Diagnosis not present

## 2023-01-13 DIAGNOSIS — G4709 Other insomnia: Secondary | ICD-10-CM | POA: Diagnosis not present

## 2023-01-13 DIAGNOSIS — I480 Paroxysmal atrial fibrillation: Secondary | ICD-10-CM | POA: Diagnosis present

## 2023-01-13 DIAGNOSIS — I5023 Acute on chronic systolic (congestive) heart failure: Secondary | ICD-10-CM | POA: Diagnosis not present

## 2023-01-13 DIAGNOSIS — I340 Nonrheumatic mitral (valve) insufficiency: Secondary | ICD-10-CM | POA: Diagnosis present

## 2023-01-13 DIAGNOSIS — G4700 Insomnia, unspecified: Secondary | ICD-10-CM | POA: Diagnosis present

## 2023-01-13 DIAGNOSIS — Z79899 Other long term (current) drug therapy: Secondary | ICD-10-CM

## 2023-01-13 DIAGNOSIS — I272 Pulmonary hypertension, unspecified: Secondary | ICD-10-CM | POA: Diagnosis present

## 2023-01-13 DIAGNOSIS — R7989 Other specified abnormal findings of blood chemistry: Secondary | ICD-10-CM

## 2023-01-13 DIAGNOSIS — L97522 Non-pressure chronic ulcer of other part of left foot with fat layer exposed: Secondary | ICD-10-CM | POA: Diagnosis not present

## 2023-01-13 DIAGNOSIS — I2489 Other forms of acute ischemic heart disease: Secondary | ICD-10-CM | POA: Diagnosis present

## 2023-01-13 DIAGNOSIS — R0609 Other forms of dyspnea: Secondary | ICD-10-CM | POA: Insufficient documentation

## 2023-01-13 DIAGNOSIS — Z7960 Long term (current) use of unspecified immunomodulators and immunosuppressants: Secondary | ICD-10-CM

## 2023-01-13 DIAGNOSIS — Z1152 Encounter for screening for COVID-19: Secondary | ICD-10-CM

## 2023-01-13 DIAGNOSIS — Z66 Do not resuscitate: Secondary | ICD-10-CM | POA: Diagnosis present

## 2023-01-13 DIAGNOSIS — Z791 Long term (current) use of non-steroidal anti-inflammatories (NSAID): Secondary | ICD-10-CM

## 2023-01-13 DIAGNOSIS — I493 Ventricular premature depolarization: Secondary | ICD-10-CM | POA: Diagnosis not present

## 2023-01-13 DIAGNOSIS — E871 Hypo-osmolality and hyponatremia: Secondary | ICD-10-CM | POA: Diagnosis present

## 2023-01-13 DIAGNOSIS — K219 Gastro-esophageal reflux disease without esophagitis: Secondary | ICD-10-CM | POA: Diagnosis present

## 2023-01-13 DIAGNOSIS — E538 Deficiency of other specified B group vitamins: Secondary | ICD-10-CM | POA: Diagnosis present

## 2023-01-13 DIAGNOSIS — I509 Heart failure, unspecified: Principal | ICD-10-CM

## 2023-01-13 DIAGNOSIS — Z7982 Long term (current) use of aspirin: Secondary | ICD-10-CM

## 2023-01-13 DIAGNOSIS — Z7952 Long term (current) use of systemic steroids: Secondary | ICD-10-CM

## 2023-01-13 DIAGNOSIS — Z8249 Family history of ischemic heart disease and other diseases of the circulatory system: Secondary | ICD-10-CM | POA: Diagnosis not present

## 2023-01-13 DIAGNOSIS — E039 Hypothyroidism, unspecified: Secondary | ICD-10-CM | POA: Diagnosis present

## 2023-01-13 DIAGNOSIS — N179 Acute kidney failure, unspecified: Secondary | ICD-10-CM | POA: Diagnosis present

## 2023-01-13 DIAGNOSIS — Z803 Family history of malignant neoplasm of breast: Secondary | ICD-10-CM

## 2023-01-13 DIAGNOSIS — M069 Rheumatoid arthritis, unspecified: Secondary | ICD-10-CM | POA: Diagnosis present

## 2023-01-13 DIAGNOSIS — I428 Other cardiomyopathies: Secondary | ICD-10-CM | POA: Diagnosis present

## 2023-01-13 DIAGNOSIS — R54 Age-related physical debility: Secondary | ICD-10-CM | POA: Diagnosis present

## 2023-01-13 DIAGNOSIS — Z9225 Personal history of immunosupression therapy: Secondary | ICD-10-CM

## 2023-01-13 DIAGNOSIS — R635 Abnormal weight gain: Secondary | ICD-10-CM | POA: Insufficient documentation

## 2023-01-13 DIAGNOSIS — Z886 Allergy status to analgesic agent status: Secondary | ICD-10-CM

## 2023-01-13 DIAGNOSIS — Z79890 Hormone replacement therapy (postmenopausal): Secondary | ICD-10-CM

## 2023-01-13 DIAGNOSIS — Z993 Dependence on wheelchair: Secondary | ICD-10-CM | POA: Diagnosis not present

## 2023-01-13 DIAGNOSIS — L089 Local infection of the skin and subcutaneous tissue, unspecified: Secondary | ICD-10-CM | POA: Insufficient documentation

## 2023-01-13 DIAGNOSIS — Z880 Allergy status to penicillin: Secondary | ICD-10-CM

## 2023-01-13 DIAGNOSIS — D84821 Immunodeficiency due to drugs: Secondary | ICD-10-CM | POA: Diagnosis present

## 2023-01-13 DIAGNOSIS — R0602 Shortness of breath: Secondary | ICD-10-CM | POA: Diagnosis present

## 2023-01-13 DIAGNOSIS — Z7401 Bed confinement status: Secondary | ICD-10-CM

## 2023-01-13 DIAGNOSIS — I5043 Acute on chronic combined systolic (congestive) and diastolic (congestive) heart failure: Principal | ICD-10-CM | POA: Diagnosis present

## 2023-01-13 HISTORY — DX: Rheumatoid arthritis, unspecified: M06.9

## 2023-01-13 HISTORY — DX: Bed confinement status: Z74.01

## 2023-01-13 LAB — PROTIME-INR
INR: 1.3 — ABNORMAL HIGH (ref 0.8–1.2)
Prothrombin Time: 16.8 s — ABNORMAL HIGH (ref 11.4–15.2)

## 2023-01-13 LAB — MAGNESIUM: Magnesium: 2.1 mg/dL (ref 1.7–2.4)

## 2023-01-13 LAB — BASIC METABOLIC PANEL
Anion gap: 13 (ref 5–15)
BUN: 21 mg/dL (ref 8–23)
CO2: 22 mmol/L (ref 22–32)
Calcium: 7.8 mg/dL — ABNORMAL LOW (ref 8.9–10.3)
Chloride: 95 mmol/L — ABNORMAL LOW (ref 98–111)
Creatinine, Ser: 0.59 mg/dL (ref 0.44–1.00)
GFR, Estimated: >60 mL/min (ref >60)
Glucose, Bld: 119 mg/dL — ABNORMAL HIGH (ref 70–99)
Potassium: 3.9 mmol/L (ref 3.5–5.1)
Sodium: 130 mmol/L — ABNORMAL LOW (ref 135–145)

## 2023-01-13 LAB — HEPATIC FUNCTION PANEL
ALT: 10 U/L (ref 0–44)
AST: 24 U/L (ref 15–41)
Albumin: 3.2 g/dL — ABNORMAL LOW (ref 3.5–5.0)
Alkaline Phosphatase: 90 U/L (ref 38–126)
Bilirubin, Direct: 0.3 mg/dL — ABNORMAL HIGH (ref 0.0–0.2)
Indirect Bilirubin: 0.6 mg/dL (ref 0.3–0.9)
Total Bilirubin: 0.9 mg/dL (ref <1.2)
Total Protein: 6.1 g/dL — ABNORMAL LOW (ref 6.5–8.1)

## 2023-01-13 LAB — BRAIN NATRIURETIC PEPTIDE: B Natriuretic Peptide: 4311.9 pg/mL — ABNORMAL HIGH (ref 0.0–100.0)

## 2023-01-13 LAB — CBC
HCT: 38.4 % (ref 36.0–46.0)
Hemoglobin: 11.9 g/dL — ABNORMAL LOW (ref 12.0–15.0)
MCH: 24.4 pg — ABNORMAL LOW (ref 26.0–34.0)
MCHC: 31 g/dL (ref 30.0–36.0)
MCV: 78.7 fL — ABNORMAL LOW (ref 80.0–100.0)
Platelets: 331 10*3/uL (ref 150–400)
RBC: 4.88 MIL/uL (ref 3.87–5.11)
RDW: 19.6 % — ABNORMAL HIGH (ref 11.5–15.5)
WBC: 10.3 10*3/uL (ref 4.0–10.5)
nRBC: 0 % (ref 0.0–0.2)

## 2023-01-13 LAB — APTT: aPTT: 30 s (ref 24–36)

## 2023-01-13 LAB — SARS CORONAVIRUS 2 BY RT PCR: SARS Coronavirus 2 by RT PCR: NEGATIVE

## 2023-01-13 LAB — TROPONIN I (HIGH SENSITIVITY)
Troponin I (High Sensitivity): 35 ng/L — ABNORMAL HIGH (ref <18)
Troponin I (High Sensitivity): 35 ng/L — ABNORMAL HIGH (ref <18)

## 2023-01-13 LAB — PROCALCITONIN: Procalcitonin: 0.19 ng/mL

## 2023-01-13 MED ORDER — METOPROLOL TARTRATE 5 MG/5ML IV SOLN
5.0000 mg | Freq: Once | INTRAVENOUS | Status: AC
  Administered 2023-01-13: 5 mg via INTRAVENOUS
  Filled 2023-01-13: qty 5

## 2023-01-13 MED ORDER — IOHEXOL 350 MG/ML SOLN
75.0000 mL | Freq: Once | INTRAVENOUS | Status: AC | PRN
Start: 2023-01-13 — End: 2023-01-13
  Administered 2023-01-13: 75 mL via INTRAVENOUS

## 2023-01-13 MED ORDER — METOPROLOL TARTRATE 25 MG PO TABS
25.0000 mg | ORAL_TABLET | Freq: Two times a day (BID) | ORAL | Status: DC
  Administered 2023-01-13 – 2023-01-16 (×6): 25 mg via ORAL
  Filled 2023-01-13 (×6): qty 1

## 2023-01-13 MED ORDER — ACETAMINOPHEN 325 MG PO TABS
650.0000 mg | ORAL_TABLET | Freq: Four times a day (QID) | ORAL | Status: DC | PRN
  Administered 2023-01-13 – 2023-01-19 (×8): 650 mg via ORAL
  Filled 2023-01-13 (×8): qty 2

## 2023-01-13 MED ORDER — MAGNESIUM SULFATE 2 GM/50ML IV SOLN
2.0000 g | Freq: Once | INTRAVENOUS | Status: AC
  Administered 2023-01-13: 2 g via INTRAVENOUS
  Filled 2023-01-13: qty 50

## 2023-01-13 MED ORDER — ONDANSETRON HCL 4 MG/2ML IJ SOLN
4.0000 mg | Freq: Four times a day (QID) | INTRAMUSCULAR | Status: AC | PRN
Start: 2023-01-13 — End: 2023-01-20

## 2023-01-13 MED ORDER — FUROSEMIDE 10 MG/ML IJ SOLN
40.0000 mg | Freq: Every day | INTRAMUSCULAR | Status: DC
  Administered 2023-01-14 – 2023-01-16 (×3): 40 mg via INTRAVENOUS
  Filled 2023-01-13 (×3): qty 4

## 2023-01-13 MED ORDER — FUROSEMIDE 10 MG/ML IJ SOLN
20.0000 mg | Freq: Once | INTRAMUSCULAR | Status: AC
  Administered 2023-01-13: 20 mg via INTRAVENOUS
  Filled 2023-01-13: qty 4

## 2023-01-13 MED ORDER — ACETAMINOPHEN 650 MG RE SUPP: 650.0000 mg | Freq: Four times a day (QID) | RECTAL | Status: DC | PRN

## 2023-01-13 MED ORDER — SENNOSIDES-DOCUSATE SODIUM 8.6-50 MG PO TABS: 1.0000 | ORAL_TABLET | Freq: Every evening | ORAL | Status: DC | PRN

## 2023-01-13 MED ORDER — HEPARIN BOLUS VIA INFUSION
4000.0000 [IU] | Freq: Once | INTRAVENOUS | Status: AC
  Administered 2023-01-13: 4000 [IU] via INTRAVENOUS
  Filled 2023-01-13: qty 4000

## 2023-01-13 MED ORDER — METOPROLOL TARTRATE 25 MG PO TABS
25.0000 mg | ORAL_TABLET | Freq: Once | ORAL | Status: AC
  Administered 2023-01-13: 25 mg via ORAL
  Filled 2023-01-13: qty 1

## 2023-01-13 MED ORDER — LEVOTHYROXINE SODIUM 50 MCG PO TABS
50.0000 ug | ORAL_TABLET | Freq: Every day | ORAL | Status: DC
  Administered 2023-01-14 – 2023-01-23 (×10): 50 ug via ORAL
  Filled 2023-01-13 (×10): qty 1

## 2023-01-13 MED ORDER — ONDANSETRON HCL 4 MG PO TABS: 4.0000 mg | ORAL_TABLET | Freq: Four times a day (QID) | ORAL | Status: AC | PRN

## 2023-01-13 MED ORDER — HEPARIN (PORCINE) 25000 UT/250ML-% IV SOLN
1500.0000 [IU]/h | INTRAVENOUS | Status: DC
  Administered 2023-01-13: 800 [IU]/h via INTRAVENOUS
  Administered 2023-01-15: 1300 [IU]/h via INTRAVENOUS
  Administered 2023-01-16 (×2): 1500 [IU]/h via INTRAVENOUS
  Filled 2023-01-13 (×5): qty 250

## 2023-01-13 NOTE — ED Notes (Signed)
Pt repositioned and pulled up in bed  

## 2023-01-13 NOTE — Hospital Course (Addendum)
Ms. Lindsay Arnold is a 82 year old female with history of rheumatoid arthritis involving multiple sites, on long-term use of immunosuppression medication, chronic bedbound and wheelchair bound state due to severe RA, history of drug-induced leukopenia, B12 deficiency, hypothyroid, who presents to the emergency department from outpatient cardiology clinic for chief concerns of weakness and shortness of breath on exertion. Lab results showed BNP 3311, troponin 35.  Patient is seen by cardiology, started on IV Lasix for exacerbation congestive heart failure. Echocardiogram showed ejection fraction 25 to 30%, grade 2 diastolic dysfunction.  Moderate mitral valve regurgitation. Heart cath performed on 11/19, showed near normal coronary arteries.  11/20.  Cardiology making adjustments to medications with switching flecainide over to amiodarone and adding metoprolol.  Patient points out a spot on her toe that did have some bleeding last week. 11/21.  Cardiology holding Lasix today and will reevaluate tomorrow and likely will start back on oral Lasix tomorrow.  Seen by podiatry and dressing changes ordered. 11/22.  Patient not sleeping very well at night.  Feels tired.  Sodium down to 135.  Does not feel hungry.  Placed back on her PPI. 11/23.  Patient complaining of left foot and ankle pain.  History of rheumatoid arthritis.  Patient's sodium also dropped down to 122.  Dose of tolvaptan ordered.  Regular diet ordered. 11/24.  Patient's sodium 126.  Start salt tablets.  Hold Lasix today. 11/25.  Patients sodium 128.  Patient will go to rehab today.

## 2023-01-13 NOTE — Consult Note (Signed)
[facility] Cardiology  CARDIOLOGY CONSULT NOTE  Patient ID: ARISSA ARDELL MRN: [id number] DOB/AGE: 1941/01/25 82 y.o.  Admit date: 01/13/2023 Referring Physician Dr. Sedalia Muta Primary Cardiologist Dr. Melton Alar Reason for Consultation shortness of breath, heart failure  HPI: 82 year old female with past medical history significant for rheumatoid arthritis involving multiple sites on long-term immunosuppressive medication, wheelchair-bound due to severe rheumatoid arthritis who was sent to hospital from cardiology clinic due to worsening shortness of breath.  In last few weeks patient has noticed chest congestion along with increased shortness of breath and pedal edema.  No chest pain/pressure.  No significant orthopnea.  No palpitations or dizziness.  EKG showed A-fib with RVR with PVCs.  BNP 4300  Review of systems complete and found to be negative unless listed above     Past Medical History:  Diagnosis Date   Bedbound    Rheumatoid arthritis of hand (HCC)     Past Surgical History:  Procedure Laterality Date   ABDOMINAL HYSTERECTOMY     BACK SURGERY     BREAST BIOPSY Left    COLONOSCOPY     COLONOSCOPY WITH PROPOFOL N/A 04/23/2015   Procedure: COLONOSCOPY WITH PROPOFOL;  Surgeon: Wallace Cullens, MD;  Location: [facility] ENDOSCOPY;  Service: Gastroenterology;  Laterality: N/A;   DG TOES RT FOOT MULTIPLE SPECIF ([facility] HX) Right    reconstruction   JOINT REPLACEMENT     ltk rtk rth  Bilateral    MR FOOT LEFT ([facility] HX)     ORIF FEMORAL NECK FRACTURE W/ DHS     POSTERIOR LAMINECTOMY / DECOMPRESSION CERVICAL SPINE     TONSILLECTOMY      (Not in a hospital admission)  Social History   Socioeconomic History   Marital status: Married    Spouse name: Not on file   Number of children: Not on file   Years of education: Not on file   Highest education level: Not on file  Occupational History   Not on file  Tobacco Use   Smoking status: Never   Smokeless tobacco: Never  Substance and Sexual  Activity   Alcohol use: Yes    Comment: 2 glasses of wine per year.   Drug use: Never   Sexual activity: Not Currently  Other Topics Concern   Not on file  Social History Narrative   Not on file   Social Determinants of Health   Financial Resource Strain: Low Risk  (01/09/2023)   Received from [facility]   Overall Financial Resource Strain (CARDIA)    Difficulty of Paying Living Expenses: Not hard at all  Food Insecurity: No Food Insecurity (01/13/2023)   Hunger Vital Sign    Worried About Running Out of Food in the Last Year: Never true    Ran Out of Food in the Last Year: Never true  Transportation Needs: No Transportation Needs (01/13/2023)   PRAPARE - Administrator, Civil Service (Medical): No    Lack of Transportation (Non-Medical): No  Physical Activity: Not on file  Stress: Not on file  Social Connections: Not on file  Intimate Partner Violence: Not At Risk (01/13/2023)   Humiliation, Afraid, Rape, and Kick questionnaire    Fear of Current or Ex-Partner: No    Emotionally Abused: No    Physically Abused: No    Sexually Abused: No    Family History  Problem Relation Age of Onset   Heart disease Mother    Heart disease Father    Breast cancer  Paternal Grandmother       Review of systems complete and found to be negative unless listed above      PHYSICAL EXAM  JVD elevated +1 to +2 pedal edema. No wheezes. No significant murmur.   Labs:   Lab Results  Component Value Date   WBC 10.3 01/13/2023   HGB 11.9 (L) 01/13/2023   HCT 38.4 01/13/2023   MCV 78.7 (L) 01/13/2023   PLT 331 01/13/2023    Recent Labs  Lab 01/13/23 1113 01/13/23 1128  NA 130*  --   K 3.9  --   CL 95*  --   CO2 22  --   BUN 21  --   CREATININE 0.59  --   CALCIUM 7.8*  --   PROT  --  6.1*  BILITOT  --  0.9  ALKPHOS  --  90  ALT  --  10  AST  --  24  GLUCOSE 119*  --    No results found for: "CKTOTAL", "CKMB", "CKMBINDEX", "TROPONINI"  No results found for: "CHOL" No results found for: "HDL" No results found for: "LDLCALC" No results found for: "TRIG" No results found for: "CHOLHDL" No results found for: "LDLDIRECT"    Radiology: [facility] Chest Port 1 View  Result Date: 01/13/2023 CLINICAL DATA:  Weakness.  Shortness of breath. EXAM: PORTABLE CHEST 1 VIEW COMPARISON:  Chest radiograph dated January 04, 2005. FINDINGS: Cardiomegaly. Pulmonary vascular congestion with bilateral basilar predominant interstitial opacities. Small layering bilateral pleural effusions. No pneumothorax. Severe degenerative changes of the bilateral shoulders. No acute osseous abnormality. IMPRESSION: Cardiomegaly with pulmonary edema and small bilateral pleural effusions. Electronically Signed   By: Hart Robinsons M.D.   On: 01/13/2023 15:41    EKG: A-fib with RVR with PVCs  ASSESSMENT AND PLAN:  Acute heart failure A-fib with RVR Frequent PVCs Rheumatoid arthritis on long term immunosuppressive medication  Continue diuresis with IV Lasix. Currently A-fib rate overall improved.  Will start metoprolol 25 mg twice daily.  If needed can further uptitrate.  If blood pressure becomes an issue, consider amiodarone drip Echocardiogram pending.  Would need ischemic evaluation.  If significant LV dysfunction noted on echo, will consider left heart catheterization on Monday.  After that would need consideration for rhythm control strategy of A-fib. Will continue to follow  Signed: Kathryne Gin MD,PhD, [facility] 01/13/2023, 4:01 PM

## 2023-01-13 NOTE — ED Notes (Signed)
RN attempted to collect heparin level x 2. Unsuccessful. Lab called to collect

## 2023-01-13 NOTE — Assessment & Plan Note (Deleted)
Etiology workup in progress, suspect acute heart failure however infectious etiology cannot be excluded at this time.  PE cannot be excluded at this time CTA PE ordered by EDP and is pending read Portable chest x-ray has been ordered by EDP and is pending read Check COVID PCR, procalcitonin on admission Strict I's and O's

## 2023-01-13 NOTE — H&P (Addendum)
History and Physical   Lindsay Arnold:[id number] DOB: Apr 19, 1940 DOA: 01/13/2023  PCP: Lynnea Ferrier, MD  Outpatient Specialists: [facility] Patient coming from: [facility] cardiology clinic  I have personally briefly reviewed patient's old medical records in [facility] EMR.  Chief Concern: Weakness, shortness of breath with exertion  HPI: Ms. Lindsay Arnold is a 82 year old female with history of rheumatoid arthritis involving multiple sites, on long-term use of immunosuppression medication, chronic bedbound and wheelchair bound state due to severe RA, history of drug-induced leukopenia, B12 deficiency, hypothyroid, who presents to the emergency department from outpatient cardiology clinic for chief concerns of weakness and shortness of breath on exertion, concerning for heart failure exacerbation versus unstable angina.  Vitals in the ED showed temperature of 98.3, respiration rate of 16, heart rate of 66, blood pressure 114/81, SpO2 of 97% on room air.  Serum sodium is 130, potassium 3.9, chloride 95, bicarb 22, BUN of 21, serum creatinine of 2.59, EGFR greater than 60, nonfasting blood glucose 119, WBC 10.3, hemoglobin 11.9, platelets of 331.  High sensitive troponin is 35.  ED treatment: Magnesium 2 g IV one-time dose, heparin bolus and gtt. ------------------------- At bedside, patient is able to tell me her name, her age, location, current calendar year.  Patient reports that she has been having worsening shortness of breath and weakness with exertion for the past 7 to 10 days.  She reports that she is normally able to pivot and transport from wheelchair to chair however this has been difficult for her due to her shortness of breath.  She reports that she has gained approximately 31 pounds since May of this year.  This weight gain is not intentional and that patient does not eat very much due to her decreased level of activity from her  chronic severe rheumatoid arthritis.  She endorses generalized lower extremity swelling and difficulty pulling on her pants.  She denies trauma to her person.  She denies chest pain, nausea, vomiting, cough, abdominal pain, dysuria, hematuria, blood in her stool or urine.  Social history: She lives at home with her husband.  She denies tobacco, EtOH, recreational drug use.  She is retired/disabled due to severe rheumatoid arthritis.  Previously she worked as a Control and instrumentation engineer.  ROS: Constitutional: + weight change, no fever ENT/Mouth: no sore throat, no rhinorrhea Eyes: no eye pain, no vision changes Cardiovascular: no chest pain, + dyspnea,  no edema, no palpitations Respiratory: no cough, no sputum, no wheezing Gastrointestinal: no nausea, no vomiting, no diarrhea, no constipation Genitourinary: no urinary incontinence, no dysuria, no hematuria Musculoskeletal: no arthralgias, no myalgias Skin: no skin lesions, no pruritus, Neuro: + weakness, no loss of consciousness, no syncope Psych: no anxiety, no depression, no decrease appetite Heme/Lymph: no bruising, no bleeding  ED Course: Discussed with EDP, patient requiring hospitalization for chief concerns of possible heart failure exacerbation versus cardiac ischemia.  Assessment/Plan  Principal Problem:   Acute exacerbation of CHF (congestive heart failure) (HCC) Active Problems:   Rheumatoid arthritis involving multiple sites [facility])   Personal history of immunosuppressive therapy   Elevated troponin   Dyspnea on exertion   Weight gain   Wheelchair dependence   Assessment and Plan:  * Acute exacerbation of CHF (congestive heart failure) (HCC) Strict I's and O's Complete echo has been ordered on admission Furosemide 20 mg IV one-time dose ordered [facility]iology, [facility] Admit to telemetry cardiac, inpatient  Wheelchair dependence And bedbound At baseline  Weight gain Suspect secondary to  heart failure  Dyspnea on exertion Etiology workup in progress, suspect acute heart failure however infectious etiology cannot be excluded at this time.  PE cannot be excluded at this time CTA PE ordered by EDP and is pending read Portable chest x-ray has been ordered by EDP and is pending read Check COVID PCR, procalcitonin on admission Strict I's and O's  Elevated troponin Etiology workup in progress, suspect secondary to demand ischemia in setting of acute heart failure exacerbation No prior complete echo to determine estimated ejection fraction status at baseline prior Continue to monitor second high sensitive troponin  Rheumatoid arthritis involving multiple sites [facility]) Patient gets subcutaneous injections outpatient every 28 days.  Her last injection was about 2 weeks ago.  Chart reviewed.   DVT prophylaxis: Heparin GTT Code Status: DNR/DNI confirmed with patient at bedside.  Patient reports that she has DNR orders at home Diet: Heart healthy Family Communication: Updated her husband at bedside with patient's permission Disposition Plan: Pending clinical course, [facility] clinic cardiology evaluation Consults called: [facility] clinic cardiology Admission status: Telemetry cardiac, inpatient  Past Medical History:  Diagnosis Date   Bedbound    Rheumatoid arthritis of hand (HCC)    Past Surgical History:  Procedure Laterality Date   ABDOMINAL HYSTERECTOMY     BACK SURGERY     BREAST BIOPSY Left    COLONOSCOPY     COLONOSCOPY WITH PROPOFOL N/A 04/23/2015   Procedure: COLONOSCOPY WITH PROPOFOL;  Surgeon: Wallace Cullens, MD;  Location: [facility] ENDOSCOPY;  Service: Gastroenterology;  Laterality: N/A;   DG TOES RT FOOT MULTIPLE SPECIF ([facility] HX) Right    reconstruction   JOINT REPLACEMENT     ltk rtk rth  Bilateral    MR FOOT LEFT ([facility] HX)     ORIF FEMORAL NECK FRACTURE W/ DHS     POSTERIOR LAMINECTOMY / DECOMPRESSION CERVICAL SPINE     TONSILLECTOMY     Social History:  reports  that she has never smoked. She has never used smokeless tobacco. She reports current alcohol use. She reports that she does not use drugs.  Allergies  Allergen Reactions   Celebrex [Celecoxib]    Penicillins    Family History  Problem Relation Age of Onset   Heart disease Mother    Heart disease Father    Breast cancer Paternal Grandmother    Family history: Family history reviewed and not pertinent.  Prior to Admission medications   Medication Sig Start Date End Date Taking? Authorizing Provider  Abatacept (ORENCIA) 125 MG/ML SOSY Inject into the skin. Reported on 04/23/2015    [provider]  acetaminophen (TYLENOL) 325 MG tablet Take 650 mg by mouth every 6 (six) hours as needed.    [provider]  aspirin 81 MG tablet Take 81 mg by mouth daily.    [provider]  azelastine (ASTELIN) 0.1 % nasal spray Place into both nostrils 2 (two) times daily. Use in each nostril as directed    [provider]  calcium-vitamin D (OSCAL WITH D) 500-200 MG-UNIT tablet Take 1 tablet by mouth.    [provider]  Clindamycin HCl (CLINDAMYCIN MONOHYDRATE) powder Apply topically.    [provider]  hydroxypropyl methylcellulose / hypromellose (ISOPTO TEARS / GONIOVISC) 2.5 % ophthalmic solution 1 drop.    [provider]  ipratropium-albuterol (DUONEB) 0.5-2.5 (3) MG/3ML SOLN Take 3 mLs by nebulization every 6 (six) hours as needed.    [provider]  leflunomide (  ARAVA) 10 MG tablet Take 10 mg by mouth daily.    [provider]  levothyroxine (SYNTHROID, LEVOTHROID) 50 MCG tablet Take 50 mcg by mouth daily before breakfast.    [provider]  meloxicam (MOBIC) 7.5 MG tablet Take 7.5 mg by mouth daily.    [provider]  multivitamin-lutein (OCUVITE-LUTEIN) CAPS capsule Take 1 capsule by mouth daily.    [provider]  omeprazole (PRILOSEC) 10 MG capsule Take 10 mg by mouth daily.     [provider]  vitamin C (ASCORBIC ACID) 500 MG tablet Take 500 mg by mouth daily.    [provider]   Physical Exam: Vitals:   01/13/23 1103 01/13/23 1105  BP:  114/81  Pulse:  66  Resp:  16  Temp:  98.3 F (36.8 C)  TempSrc:  Oral  SpO2:  97%  Weight: 66.7 kg   Height: 5\' 5"  (1.651 m)    Constitutional: appears age-appropriate, frail, NAD, calm Eyes: PERRL, lids and conjunctivae normal ENMT: Mucous membranes are moist. Posterior pharynx clear of any exudate or lesions. Age-appropriate dentition. Hearing appropriat Neck: normal, supple, no masses, no thyromegaly Respiratory: clear to auscultation bilaterally, no wheezing, no crackles. Normal respiratory effort. No accessory muscle use.  Cardiovascular: Regular rate and rhythm, no murmurs / rubs / gallops. No extremity edema. 2+ pedal pulses. No carotid bruits.  Abdomen: no tenderness, no masses palpated, no hepatosplenomegaly. Bowel sounds positive.  Musculoskeletal: no clubbing / cyanosis.  Multiple joint deformities, attributed to rheumatoid arthritis involving the bilateral ankles, feet, wrists, fingers and hip.  Good ROM,  no atrophy. Normal muscle tone.  Skin: no rashes, lesions, ulcers. No induration. Pressure ulcer present Neurologic: Sensation intact. Strength 5/5 in all 4.  Psychiatric: Normal judgment and insight. Alert and oriented x 3. Normal mood.   EKG: independently reviewed, showing atrial tachycardia with rate of 144, QTc 452  Chest x-ray on Admission: I personally reviewed and bilateral pulmonary edema with possible bilateral basil atelectasis  No results found.  Labs on Admission: I have personally reviewed following labs  CBC: Recent Labs  Lab 01/13/23 1113  WBC 10.3  HGB 11.9*  HCT 38.4  MCV 78.7*  PLT 331   Basic Metabolic Panel: Recent Labs  Lab 01/13/23 1113  NA 130*  K 3.9  CL 95*  CO2 22  GLUCOSE 119*  BUN 21  CREATININE 0.59  CALCIUM 7.8*  MG 2.1    GFR: Estimated Creatinine Clearance: 49.6 mL/min (by C-G formula based on SCr of 0.59 mg/dL).  Urine analysis:    Component Value Date/Time   COLORURINE Yellow 10/11/2011 1509   APPEARANCEUR Hazy 10/11/2011 1509   LABSPEC 1.011 10/11/2011 1509   PHURINE 6.0 10/11/2011 1509   GLUCOSEU Negative 10/11/2011 1509   HGBUR Negative 10/11/2011 1509   BILIRUBINUR Negative 10/11/2011 1509   KETONESUR Negative 10/11/2011 1509   PROTEINUR Negative 10/11/2011 1509   NITRITE Negative 10/11/2011 1509   LEUKOCYTESUR Negative 10/11/2011 1509   This document was prepared using Dragon Voice Recognition software and may include unintentional dictation errors.  Dr. Sedalia Muta [facility]  If 7PM-7AM, please contact overnight-coverage provider If 7AM-7PM, please contact day attending provider [email or web address]  01/13/2023, 2:38 PM

## 2023-01-13 NOTE — Assessment & Plan Note (Signed)
Suspect secondary to heart failure

## 2023-01-13 NOTE — Consult Note (Signed)
Pharmacy Consult Note - Anticoagulation  Pharmacy Consult for heparin Indication: chest pain/ACS  PATIENT MEASUREMENTS: Height: 5\' 5"  (165.1 cm) Weight: 66.7 kg (147 lb 0.8 oz) IBW/kg (Calculated) : 57 HEPARIN DW (KG): 66.7  VITAL SIGNS: Temp: 98.3 F (36.8 C) (11/15 1105) Temp Source: Oral (11/15 1105) BP: 114/81 (11/15 1105) Pulse Rate: 66 (11/15 1105)  Recent Labs    01/13/23 1113  HGB 11.9*  HCT 38.4  PLT 331  CREATININE 0.59  TROPONINIHS 35*    Estimated Creatinine Clearance: 49.6 mL/min (by C-G formula based on SCr of 0.59 mg/dL).  PAST MEDICAL HISTORY: No past medical history on file.  ASSESSMENT: 82 y.o. female with PMH rheumatoid arthritis, chronically bedbound is presenting with concerns for multiple PVCs and now impending cardiology workup. cTn mildly elevated at 35. Patient is not on chronic anticoagulation per chart review. Pharmacy has been consulted to initiate and manage heparin intravenous infusion.  Pertinent medications: Aspirin 81 mg PO daily (home med)   Goal(s) of therapy: Heparin level 0.3 - 0.7 units/mL Monitor platelets by anticoagulation protocol: Yes   Baseline anticoagulation labs: Recent Labs    01/13/23 1113  HGB 11.9*  PLT 331    Date Time aPTT/HL Rate/Comment     PLAN: Give 4000 units bolus x1; then start heparin infusion at 800 units/hour. Check heparin level in 8 hours, then daily once at least two levels are consecutively therapeutic. Monitor CBC daily while on heparin infusion.   Will M. Dareen Piano, PharmD Clinical Pharmacist 01/13/2023 12:19 PM

## 2023-01-13 NOTE — ED Triage Notes (Signed)
Seen by Dr. Graciela Husbands on Monday, had tests done.  Today was at cardiology and sent to ED for evaluation.  C/O weakness x 7-10 days.  Also c/o SOB.  Presents AAOx3.  Skin warm and dry. NAD

## 2023-01-13 NOTE — Assessment & Plan Note (Addendum)
Patient gets subcutaneous injections outpatient every 28 days.  Patient on chronic prednisone.  With left ankle pain, continue Solu-Medrol

## 2023-01-13 NOTE — Assessment & Plan Note (Deleted)
Strict I's and O's Complete echo has been ordered on admission Furosemide 20 mg IV one-time dose ordered [facility]iology, [facility] been [facility] Admit to telemetry cardiac, inpatient

## 2023-01-13 NOTE — ED Notes (Signed)
Blood drawn for heparin eval 2 blue top labeled with pt labels @ bs confirmed by pt verbally sent to lab via ED tube system

## 2023-01-13 NOTE — ED Notes (Signed)
Pt to CT

## 2023-01-13 NOTE — Assessment & Plan Note (Signed)
Etiology workup in progress, suspect secondary to demand ischemia in setting of acute heart failure exacerbation No prior complete echo to determine estimated ejection fraction status at baseline prior Continue to monitor second high sensitive troponin

## 2023-01-13 NOTE — Assessment & Plan Note (Addendum)
Patient able to pivot on her feet but unable to walk.

## 2023-01-13 NOTE — ED Provider Notes (Signed)
[facility] Provider Note    Event Date/Time   First MD Initiated Contact with Patient 01/13/23 1117     (approximate)   History   Palpitations   HPI  Lindsay Arnold is a 82 y.o. female with rheumatoid arthritis who comes in with concerns for palpitation, shortness of breath.  Patient reports that she has been seen by cardiology.  Patient reports some intermittent shortness of breath.  She does have rheumatoid arthritis and is bedbound at baseline.  No history of blood clots.  No falls and hitting her head no issues with bleeding.  She states that she was sent over here for admission.  She has been on diuretics for the past 2 days.   Patient was sent in from cardiology.  I reviewed the note from Dr. Melton Alar where patient had a positive troponin as well as concern for multiple PVCs and so sent in for admission as well as consultation to Dr Corky Sing for consideration of cath on Monday   Physical Exam   Triage Vital Signs: ED Triage Vitals  Encounter Vitals Group     BP 01/13/23 1105 114/81     Systolic BP Percentile --      Diastolic BP Percentile --      Pulse Rate 01/13/23 1105 66     Resp 01/13/23 1105 16     Temp 01/13/23 1105 98.3 F (36.8 C)     Temp Source 01/13/23 1105 Oral     SpO2 01/13/23 1105 97 %     Weight 01/13/23 1103 147 lb 0.8 oz (66.7 kg)     Height 01/13/23 1103 5\' 5"  (1.651 m)     Head Circumference --      Peak Flow --      Pain Score 01/13/23 1103 0     Pain Loc --      Pain Education --      Exclude from Growth Chart --     Most recent vital signs: Vitals:   01/13/23 1105  BP: 114/81  Pulse: 66  Resp: 16  Temp: 98.3 F (36.8 C)  SpO2: 97%     General: Awake, no distress.  CV:  Good peripheral perfusion.  Tachycardic, occasional PVCs Resp:  Normal effort.  Abd:  No distention.  Other:  Bilateral leg swelling noted   ED Results / Procedures / Treatments   Labs (all labs ordered are listed, but only  abnormal results are displayed) Labs Reviewed  BASIC METABOLIC PANEL  CBC  TROPONIN I (HIGH SENSITIVITY)     EKG  My interpretation of EKG:  EKG shows a tachycardia with a rate of 141 with recurrent PVCs, no ST elevation, no T wave versions  RADIOLOGY I have reviewed the xray personally and interpreted and some edema noted  PROCEDURES:  Critical Care performed: Yes, see critical care procedure note(s)  .1-3 Lead EKG Interpretation  Performed by: Concha Se, MD Authorized by: Concha Se, MD     Interpretation: abnormal     ECG rate:  115   ECG rate assessment: tachycardic     Rhythm: sinus rhythm     Ectopy: PVCs     Conduction: normal   .Critical Care  Performed by: Concha Se, MD Authorized by: Concha Se, MD   Critical care provider statement:    Critical care time (minutes):  30   Critical care was necessary to treat or prevent imminent or life-threatening deterioration of the following conditions:  Cardiac failure   Critical care was time spent personally by me on the following activities:  Development of treatment plan with patient or surrogate, discussions with consultants, evaluation of patient's response to treatment, examination of patient, ordering and review of laboratory studies, ordering and review of radiographic studies, ordering and performing treatments and interventions, pulse oximetry, re-evaluation of patient's condition and review of old charts    MEDICATIONS ORDERED IN ED: Medications  metoprolol tartrate (LOPRESSOR) injection 5 mg (has no administration in time range)  magnesium sulfate IVPB 2 g 50 mL (has no administration in time range)     IMPRESSION / MDM / ASSESSMENT AND PLAN / ED COURSE  I reviewed the triage vital signs and the nursing notes.   Patient's presentation is most consistent with acute presentation with potential threat to life or bodily function.   Patient comes in tachycardic with a irregular rhythm with  there is lots of PVCs noted.  Discussed with the cardiologist who sent them over here who recommended 2 g of magnesium and a 5 of IV metoprolol and heparinization.  Given she is immobile I will get CT PE to make shows no pulmonary embolism that could be driving this process but I am more concerned about potential heart failure given her leg swelling and edema noted on chest x-ray.  BMP shows low sodium but suspect related to fluid overload.  CBC is reassuring initial troponin is slightly elevated we will continue to trend.  Troponin slightly elevated  I did message Dr Jerilynn Mages cardiology so he can follow-up with patient.  I will discuss the hospital team for admission with a CT scan pending.  The patient is on the cardiac monitor to evaluate for evidence of arrhythmia and/or significant heart rate changes.      FINAL CLINICAL IMPRESSION(S) / ED DIAGNOSES   Final diagnoses:  Congestive heart failure, unspecified HF chronicity, unspecified heart failure type (HCC)     Rx / DC Orders   ED Discharge Orders     None        Note:  This document was prepared using Dragon voice recognition software and may include unintentional dictation errors.   Concha Se, MD 01/13/23 [id number]

## 2023-01-14 ENCOUNTER — Inpatient Hospital Stay
Admit: 2023-01-14 | Discharge: 2023-01-14 | Disposition: A | Discharge status: Home / Self Care | Payer: Medicare Other | Attending: Internal Medicine | Admitting: Internal Medicine

## 2023-01-14 DIAGNOSIS — I509 Heart failure, unspecified: Secondary | ICD-10-CM

## 2023-01-14 DIAGNOSIS — M069 Rheumatoid arthritis, unspecified: Secondary | ICD-10-CM

## 2023-01-14 DIAGNOSIS — R9431 Abnormal electrocardiogram [ECG] [EKG]: Secondary | ICD-10-CM | POA: Diagnosis not present

## 2023-01-14 DIAGNOSIS — R7989 Other specified abnormal findings of blood chemistry: Secondary | ICD-10-CM

## 2023-01-14 DIAGNOSIS — E871 Hypo-osmolality and hyponatremia: Secondary | ICD-10-CM

## 2023-01-14 LAB — BASIC METABOLIC PANEL
Anion gap: 11 (ref 5–15)
BUN: 22 mg/dL (ref 8–23)
CO2: 25 mmol/L (ref 22–32)
Calcium: 7.4 mg/dL — ABNORMAL LOW (ref 8.9–10.3)
Chloride: 95 mmol/L — ABNORMAL LOW (ref 98–111)
Creatinine, Ser: 0.68 mg/dL (ref 0.44–1.00)
GFR, Estimated: >60 mL/min (ref >60)
Glucose, Bld: 87 mg/dL (ref 70–99)
Potassium: 3.6 mmol/L (ref 3.5–5.1)
Sodium: 131 mmol/L — ABNORMAL LOW (ref 135–145)

## 2023-01-14 LAB — CBC
HCT: 36.1 % (ref 36.0–46.0)
Hemoglobin: 11.4 g/dL — ABNORMAL LOW (ref 12.0–15.0)
MCH: 24.6 pg — ABNORMAL LOW (ref 26.0–34.0)
MCHC: 31.6 g/dL (ref 30.0–36.0)
MCV: 78 fL — ABNORMAL LOW (ref 80.0–100.0)
Platelets: 269 10*3/uL (ref 150–400)
RBC: 4.63 MIL/uL (ref 3.87–5.11)
RDW: 19.3 % — ABNORMAL HIGH (ref 11.5–15.5)
WBC: 5.8 10*3/uL (ref 4.0–10.5)
nRBC: 0 % (ref 0.0–0.2)

## 2023-01-14 LAB — HEPARIN LEVEL (UNFRACTIONATED)
Heparin Unfractionated: <0.1 [IU]/mL — ABNORMAL LOW (ref 0.30–0.70)
Heparin Unfractionated: <0.1 [IU]/mL — ABNORMAL LOW (ref 0.30–0.70)
Heparin Unfractionated: 0.24 [IU]/mL — ABNORMAL LOW (ref 0.30–0.70)

## 2023-01-14 MED ORDER — HEPARIN BOLUS VIA INFUSION
2000.0000 [IU] | Freq: Once | INTRAVENOUS | Status: AC
  Administered 2023-01-14: 2000 [IU] via INTRAVENOUS
  Filled 2023-01-14: qty 2000

## 2023-01-14 MED ORDER — HEPARIN BOLUS VIA INFUSION
1000.0000 [IU] | Freq: Once | INTRAVENOUS | Status: AC
  Administered 2023-01-14: 1000 [IU] via INTRAVENOUS
  Filled 2023-01-14: qty 1000

## 2023-01-14 MED ORDER — PREDNISONE 5 MG PO TABS
7.5000 mg | ORAL_TABLET | Freq: Every day | ORAL | Status: DC
  Administered 2023-01-14 – 2023-01-20 (×6): 7.5 mg via ORAL
  Filled 2023-01-14 (×8): qty 1

## 2023-01-14 NOTE — Consult Note (Addendum)
Pharmacy Consult Note - Anticoagulation  Pharmacy Consult for heparin Indication: chest pain/ACS  PATIENT MEASUREMENTS: Height: 5\' 5"  (165.1 cm) Weight: 66.7 kg (147 lb 0.8 oz) IBW/kg (Calculated) : 57 HEPARIN DW (KG): 66.7  VITAL SIGNS: BP: 101/68 (11/16 1000) Pulse Rate: 99 (11/16 1000)  Recent Labs    01/13/23 1320 01/13/23 2248 01/14/23 0559 01/14/23 1034  HGB  --   --  11.4*  --   HCT  --   --  36.1  --   PLT  --   --  269  --   APTT 30  --   --   --   LABPROT 16.8*  --   --   --   INR 1.3*  --   --   --   HEPARINUNFRC  --    < >  --  <0.10*  CREATININE  --   --  0.68  --   TROPONINIHS 35*  --   --   --    < > = values in this interval not displayed.    Estimated Creatinine Clearance: 49.6 mL/min (by C-G formula based on SCr of 0.68 mg/dL).  PAST MEDICAL HISTORY: Past Medical History:  Diagnosis Date   Bedbound    Rheumatoid arthritis of hand (HCC)     ASSESSMENT: 82 y.o. female with PMH rheumatoid arthritis, chronically bedbound is presenting with concerns for multiple PVCs and now impending cardiology workup. cTn mildly elevated at 35. Patient is not on chronic anticoagulation per chart review. Pharmacy has been consulted to initiate and manage heparin intravenous infusion.  Pertinent medications: Aspirin 81 mg PO daily (home med)   Goal(s) of therapy: Heparin level 0.3 - 0.7 units/mL Monitor platelets by anticoagulation protocol: Yes   Baseline anticoagulation labs: Recent Labs    01/13/23 1113 01/13/23 1320 01/14/23 0559  APTT  --  30  --   INR  --  1.3*  --   HGB 11.9*  --  11.4*  PLT 331  --  269    Date Time aPTT/HL Rate/Comment 11/15 2248 HL < 0.10 Subtherapeutic 11/16 1034 HL < 0.10 SUBtherapeutic     PLAN: 11/16  1034  HL < 0.10 SUBtherapeutic Verified with ED RN that Heparin drip is actually running Give 2000 units bolus x 1 Increase heparin infusion to 1200 units/hour. Recheck heparin level in 8 hours after rate change, then  daily once at least two levels are consecutively therapeutic. Monitor CBC daily while on heparin infusion.  Bari Mantis PharmD Clinical Pharmacist 01/14/2023

## 2023-01-14 NOTE — Progress Notes (Signed)
Echocardiogram 2D Echocardiogram has been performed.  Lindsay Arnold 01/14/2023, 4:00 PM

## 2023-01-14 NOTE — Consult Note (Signed)
Pharmacy Consult Note - Anticoagulation  Pharmacy Consult for heparin Indication: chest pain/ACS  PATIENT MEASUREMENTS: Height: 5\' 5"  (165.1 cm) Weight: 66.7 kg (147 lb 0.8 oz) IBW/kg (Calculated) : 57 HEPARIN DW (KG): 66.7  VITAL SIGNS: Temp: 97.9 F (36.6 C) (11/16 1938) Temp Source: Oral (11/16 1938) BP: 131/77 (11/16 1938) Pulse Rate: 59 (11/16 1938)  Recent Labs    01/13/23 1320 01/13/23 2248 01/14/23 0559 01/14/23 1034 01/14/23 1953  HGB  --   --  11.4*  --   --   HCT  --   --  36.1  --   --   PLT  --   --  269  --   --   APTT 30  --   --   --   --   LABPROT 16.8*  --   --   --   --   INR 1.3*  --   --   --   --   HEPARINUNFRC  --    < >  --    < > 0.24*  CREATININE  --   --  0.68  --   --   TROPONINIHS 35*  --   --   --   --    < > = values in this interval not displayed.    Estimated Creatinine Clearance: 49.6 mL/min (by C-G formula based on SCr of 0.68 mg/dL).  PAST MEDICAL HISTORY: Past Medical History:  Diagnosis Date   Bedbound    Rheumatoid arthritis of hand (HCC)     ASSESSMENT: 82 y.o. female with PMH rheumatoid arthritis, chronically bedbound is presenting with concerns for multiple PVCs and now impending cardiology workup. cTn mildly elevated at 35. Patient is not on chronic anticoagulation per chart review. Pharmacy has been consulted to initiate and manage heparin intravenous infusion.  Pertinent medications: Aspirin 81 mg PO daily (home med)   Goal(s) of therapy: Heparin level 0.3 - 0.7 units/mL Monitor platelets by anticoagulation protocol: Yes   Baseline anticoagulation labs: Recent Labs    01/13/23 1113 01/13/23 1320 01/14/23 0559  APTT  --  30  --   INR  --  1.3*  --   HGB 11.9*  --  11.4*  PLT 331  --  269    Date Time aPTT/HL Rate/Comment 11/15 2248 HL < 0.10 Subtherapeutic 11/16 1034 HL < 0.10 SUBtherapeutic 11/16 1953 HL 0.24 SUBtherapeutic     PLAN: Give 1000 units bolus x 1 Increase heparin infusion to 1300  units/hour. Recheck heparin level in 8 hours after rate change, then daily once at least two levels are consecutively therapeutic. Monitor CBC daily while on heparin infusion.  Thank you for involving pharmacy in this patient's care.   Rockwell Alexandria, PharmD Clinical Pharmacist 01/14/2023 9:31 PM

## 2023-01-14 NOTE — Consult Note (Signed)
Pharmacy Consult Note - Anticoagulation  Pharmacy Consult for heparin Indication: chest pain/ACS  PATIENT MEASUREMENTS: Height: 5\' 5"  (165.1 cm) Weight: 66.7 kg (147 lb 0.8 oz) IBW/kg (Calculated) : 57 HEPARIN DW (KG): 66.7  VITAL SIGNS: BP: 130/85 (11/15 2345) Pulse Rate: 116 (11/15 2345)  Recent Labs    01/13/23 1113 01/13/23 1320 01/13/23 2248  HGB 11.9*  --   --   HCT 38.4  --   --   PLT 331  --   --   APTT  --  30  --   LABPROT  --  16.8*  --   INR  --  1.3*  --   HEPARINUNFRC  --   --  <0.10*  CREATININE 0.59  --   --   TROPONINIHS 35* 35*  --     Estimated Creatinine Clearance: 49.6 mL/min (by C-G formula based on SCr of 0.59 mg/dL).  PAST MEDICAL HISTORY: Past Medical History:  Diagnosis Date   Bedbound    Rheumatoid arthritis of hand (HCC)     ASSESSMENT: 82 y.o. female with PMH rheumatoid arthritis, chronically bedbound is presenting with concerns for multiple PVCs and now impending cardiology workup. cTn mildly elevated at 35. Patient is not on chronic anticoagulation per chart review. Pharmacy has been consulted to initiate and manage heparin intravenous infusion.  Pertinent medications: Aspirin 81 mg PO daily (home med)   Goal(s) of therapy: Heparin level 0.3 - 0.7 units/mL Monitor platelets by anticoagulation protocol: Yes   Baseline anticoagulation labs: Recent Labs    01/13/23 1113 01/13/23 1320  APTT  --  30  INR  --  1.3*  HGB 11.9*  --   PLT 331  --     Date Time aPTT/HL Rate/Comment 11/15 2248 HL < 0.1 subtherapeutic    PLAN: Give 2000 units bolus x 1 Increase heparin infusion to 1000 units/hour. Recheck heparin level in 8 hours after rate change, then daily once at least two levels are consecutively therapeutic. Monitor CBC daily while on heparin infusion.  Otelia Sergeant, PharmD, [facility] 01/14/2023 1:36 AM

## 2023-01-14 NOTE — Progress Notes (Signed)
  Progress Note   Patient: Lindsay Arnold ZOX:[id number] DOB: August 25, 1940 DOA: 01/13/2023     1 DOS: the patient was seen and examined on 01/14/2023   Brief hospital course: Ms. Lindsay Arnold is a 82 year old female with history of rheumatoid arthritis involving multiple sites, on long-term use of immunosuppression medication, chronic bedbound and wheelchair bound state due to severe RA, history of drug-induced leukopenia, B12 deficiency, hypothyroid, who presents to the emergency department from outpatient cardiology clinic for chief concerns of weakness and shortness of breath on exertion. Lab results showed BNP 3311, troponin 35.  Patient is seen by cardiology, started on IV Lasix for exacerbation congestive heart failure.   Principal Problem:   Acute exacerbation of CHF (congestive heart failure) (HCC) Active Problems:   Rheumatoid arthritis involving multiple sites [facility])   Personal history of immunosuppressive therapy   Elevated troponin   Dyspnea on exertion   Weight gain   Wheelchair dependence   Hyponatremia   Assessment and Plan: * Acute exacerbation of CHF (congestive heart failure) (HCC) Elevated troponin secondary to congestive heart failure with demand ischemia. Currently pending echocardiogram to evaluate LV systolic function.  Patient had clearly volume overload, had a significant weight gain over the last month, significant edema.  Continue IV Lasix, monitor electrolytes. Cardiology seen patient, consider ischemia workup if ejection fraction is low.  Paroxysmal atrial fibrillation with RVR. Currently on heparin drip, heart rate better controlled.  Will transition to Eliquis if no additional workup for ischemia is needed.  Hyponatremia. Sodium level 131, follow BMP with diuresis.   Wheelchair dependence Rheumatoid arthritis involving multiple sites [facility]) Follow-up with PCP as outpatient.       Subjective:  Patient feels better today, less shortness of  breath.  Physical Exam: Vitals:   01/14/23 0230 01/14/23 0500 01/14/23 0907 01/14/23 1000  BP:  110/74 (!) 102/58 101/68  Pulse:  (!) 101 97 99  Resp: (!) 22 17  (!) 22  Temp:      TempSrc:      SpO2: 99% 98%  99%  Weight:      Height:       General exam: Appears calm and comfortable  Respiratory system: Clear to auscultation. Respiratory effort normal. Cardiovascular system: Irregular. No JVD, murmurs, rubs, gallops or clicks.  Anasarca Gastrointestinal system: Abdomen is nondistended, soft and nontender. No organomegaly or masses felt. Normal bowel sounds heard. Central nervous system: Alert and oriented. No focal neurological deficits. Extremities: 3+ leg edema. Skin: No rashes, lesions or ulcers Psychiatry: Judgement and insight appear normal. Mood & affect appropriate.    Data Reviewed:  Reviewed lab results, CT scan results.  Family Communication: Husband updated at bedside.  Disposition: Status is: Inpatient Remains inpatient appropriate because: Severity of disease, IV treatment.     Time spent: 50 minutes  Author: Marrion Coy, MD 01/14/2023 11:38 AM  For on call review [email or web address].

## 2023-01-15 DIAGNOSIS — I5043 Acute on chronic combined systolic (congestive) and diastolic (congestive) heart failure: Secondary | ICD-10-CM | POA: Diagnosis not present

## 2023-01-15 DIAGNOSIS — E871 Hypo-osmolality and hyponatremia: Secondary | ICD-10-CM | POA: Diagnosis not present

## 2023-01-15 DIAGNOSIS — M069 Rheumatoid arthritis, unspecified: Secondary | ICD-10-CM | POA: Diagnosis not present

## 2023-01-15 DIAGNOSIS — I5023 Acute on chronic systolic (congestive) heart failure: Secondary | ICD-10-CM

## 2023-01-15 LAB — ECHOCARDIOGRAM COMPLETE
AR max vel: 2.63 cm2
AV Peak grad: 5 mm[Hg]
Ao pk vel: 1.12 m/s
Area-P 1/2: 3.4 cm2
Calc EF: 20.4 %
Height: 65 in
MV M vel: 4.26 m/s
MV Peak grad: 72.5 mm[Hg]
P 1/2 time: 345 ms
Radius: 0.4 cm
S' Lateral: 4.6 cm
Single Plane A2C EF: 14.1 %
Single Plane A4C EF: 23.3 %
Weight: 2352.75 [oz_av]

## 2023-01-15 LAB — CBC
HCT: 36 % (ref 36.0–46.0)
Hemoglobin: 11.3 g/dL — ABNORMAL LOW (ref 12.0–15.0)
MCH: 24.9 pg — ABNORMAL LOW (ref 26.0–34.0)
MCHC: 31.4 g/dL (ref 30.0–36.0)
MCV: 79.3 fL — ABNORMAL LOW (ref 80.0–100.0)
Platelets: 252 10*3/uL (ref 150–400)
RBC: 4.54 MIL/uL (ref 3.87–5.11)
RDW: 18.8 % — ABNORMAL HIGH (ref 11.5–15.5)
WBC: 6.1 10*3/uL (ref 4.0–10.5)
nRBC: 0 % (ref 0.0–0.2)

## 2023-01-15 LAB — BASIC METABOLIC PANEL
Anion gap: 9 (ref 5–15)
BUN: 25 mg/dL — ABNORMAL HIGH (ref 8–23)
CO2: 26 mmol/L (ref 22–32)
Calcium: 7.2 mg/dL — ABNORMAL LOW (ref 8.9–10.3)
Chloride: 95 mmol/L — ABNORMAL LOW (ref 98–111)
Creatinine, Ser: 0.78 mg/dL (ref 0.44–1.00)
GFR, Estimated: >60 mL/min (ref >60)
Glucose, Bld: 104 mg/dL — ABNORMAL HIGH (ref 70–99)
Potassium: 3.4 mmol/L — ABNORMAL LOW (ref 3.5–5.1)
Sodium: 130 mmol/L — ABNORMAL LOW (ref 135–145)

## 2023-01-15 LAB — MAGNESIUM: Magnesium: 2.2 mg/dL (ref 1.7–2.4)

## 2023-01-15 LAB — HEPARIN LEVEL (UNFRACTIONATED)
Heparin Unfractionated: 0.43 [IU]/mL (ref 0.30–0.70)
Heparin Unfractionated: 0.19 [IU]/mL — ABNORMAL LOW (ref 0.30–0.70)
Heparin Unfractionated: 0.67 [IU]/mL (ref 0.30–0.70)

## 2023-01-15 MED ORDER — HEPARIN BOLUS VIA INFUSION
2000.0000 [IU] | Freq: Once | INTRAVENOUS | Status: AC
  Administered 2023-01-15: 2000 [IU] via INTRAVENOUS
  Filled 2023-01-15: qty 2000

## 2023-01-15 MED ORDER — POTASSIUM CHLORIDE CRYS ER 20 MEQ PO TBCR
40.0000 meq | EXTENDED_RELEASE_TABLET | ORAL | Status: AC
  Administered 2023-01-15 (×2): 40 meq via ORAL
  Filled 2023-01-15 (×2): qty 2

## 2023-01-15 MED ORDER — SODIUM CHLORIDE 0.9% FLUSH
3.0000 mL | Freq: Two times a day (BID) | INTRAVENOUS | Status: DC
  Administered 2023-01-15 – 2023-01-23 (×15): 3 mL via INTRAVENOUS

## 2023-01-15 NOTE — Consult Note (Signed)
Pharmacy Consult Note - Anticoagulation  Pharmacy Consult for heparin Indication: chest pain/ACS  PATIENT MEASUREMENTS: Height: 5\' 5"  (165.1 cm) Weight: 66.7 kg (147 lb 0.8 oz) IBW/kg (Calculated) : 57 HEPARIN DW (KG): 66.7  VITAL SIGNS: Temp: 98.4 F (36.9 C) (11/17 1114) Temp Source: Oral (11/17 1114) BP: 109/78 (11/17 1114) Pulse Rate: 49 (11/17 1114)  Recent Labs    01/13/23 1320 01/13/23 2248 01/15/23 0251 01/15/23 1103  HGB  --    < > 11.3*  --   HCT  --    < > 36.0  --   PLT  --    < > 252  --   APTT 30  --   --   --   LABPROT 16.8*  --   --   --   INR 1.3*  --   --   --   HEPARINUNFRC  --    < > 0.43 0.19*  CREATININE  --    < > 0.78  --   TROPONINIHS 35*  --   --   --    < > = values in this interval not displayed.    Estimated Creatinine Clearance: 49.6 mL/min (by C-G formula based on SCr of 0.78 mg/dL).  PAST MEDICAL HISTORY: Past Medical History:  Diagnosis Date   Bedbound    Rheumatoid arthritis of hand (HCC)     ASSESSMENT: 82 y.o. female with PMH rheumatoid arthritis, chronically bedbound is presenting with concerns for multiple PVCs and now impending cardiology workup. cTn mildly elevated at 35. Patient is not on chronic anticoagulation per chart review. Pharmacy has been consulted to initiate and manage heparin intravenous infusion.  Pertinent medications: Aspirin 81 mg PO daily (home med)   Goal(s) of therapy: Heparin level 0.3 - 0.7 units/mL Monitor platelets by anticoagulation protocol: Yes   Baseline anticoagulation labs: Recent Labs    01/13/23 1113 01/13/23 1320 01/14/23 0559 01/15/23 0251  APTT  --  30  --   --   INR  --  1.3*  --   --   HGB 11.9*  --  11.4* 11.3*  PLT 331  --  269 252    Date Time aPTT/HL Rate/Comment 11/15 2248 HL < 0.10 Subtherapeutic 11/16 1034 HL < 0.10 SUBtherapeutic 11/16 1953 HL 0.24 SUBtherapeutic 11/17 0251 HL 0.43 Therapeutic x 1 11/17  1103 HL 0.19 SUBtherapeutic     PLAN: 11/17   1103  HL 0.19   SUBtherapeutic Will order 2000 units bolus x 1 Increase heparin infusion to 1500 units/hour. Recheck heparin level in 8 hours after rate change, then daily once at least two levels are consecutively therapeutic. Monitor CBC daily while on heparin infusion.  Thank you for involving pharmacy in this patient's care.   Bari Mantis PharmD Clinical Pharmacist 01/15/2023

## 2023-01-15 NOTE — Consult Note (Signed)
Pharmacy Consult Note - Anticoagulation  Pharmacy Consult for heparin Indication: chest pain/ACS  PATIENT MEASUREMENTS: Height: 5\' 5"  (165.1 cm) Weight: 66.7 kg (147 lb 0.8 oz) IBW/kg (Calculated) : 57 HEPARIN DW (KG): 66.7  VITAL SIGNS: Temp: 97.6 F (36.4 C) (11/17 0333) Temp Source: Oral (11/17 0333) BP: 134/67 (11/17 0333) Pulse Rate: 108 (11/17 0333)  Recent Labs    01/13/23 1320 01/13/23 2248 01/15/23 0251  HGB  --    < > 11.3*  HCT  --    < > 36.0  PLT  --    < > 252  APTT 30  --   --   LABPROT 16.8*  --   --   INR 1.3*  --   --   HEPARINUNFRC  --    < > 0.43  CREATININE  --    < > 0.78  TROPONINIHS 35*  --   --    < > = values in this interval not displayed.    Estimated Creatinine Clearance: 49.6 mL/min (by C-G formula based on SCr of 0.78 mg/dL).  PAST MEDICAL HISTORY: Past Medical History:  Diagnosis Date   Bedbound    Rheumatoid arthritis of hand (HCC)     ASSESSMENT: 82 y.o. female with PMH rheumatoid arthritis, chronically bedbound is presenting with concerns for multiple PVCs and now impending cardiology workup. cTn mildly elevated at 35. Patient is not on chronic anticoagulation per chart review. Pharmacy has been consulted to initiate and manage heparin intravenous infusion.  Pertinent medications: Aspirin 81 mg PO daily (home med)   Goal(s) of therapy: Heparin level 0.3 - 0.7 units/mL Monitor platelets by anticoagulation protocol: Yes   Baseline anticoagulation labs: Recent Labs    01/13/23 1113 01/13/23 1320 01/14/23 0559 01/15/23 0251  APTT  --  30  --   --   INR  --  1.3*  --   --   HGB 11.9*  --  11.4* 11.3*  PLT 331  --  269 252    Date Time aPTT/HL Rate/Comment 11/15 2248 HL < 0.10 Subtherapeutic 11/16 1034 HL < 0.10 SUBtherapeutic 11/16 1953 HL 0.24 SUBtherapeutic 11/17 0251 HL 0.43 Therapeutic x 1     PLAN: Continue heparin infusion at 1300 units/hour. Recheck heparin level in 8 hours to confirm, then daily once at  least two levels are consecutively therapeutic. Monitor CBC daily while on heparin infusion.  Thank you for involving pharmacy in this patient's care.   Otelia Sergeant, PharmD, [facility] 01/15/2023 4:27 AM

## 2023-01-15 NOTE — Consult Note (Signed)
Pharmacy Consult Note - Anticoagulation  Pharmacy Consult for heparin Indication: chest pain/ACS  PATIENT MEASUREMENTS: Height: 5\' 5"  (165.1 cm) Weight: 66.7 kg (147 lb 0.8 oz) IBW/kg (Calculated) : 57 HEPARIN DW (KG): 66.7  VITAL SIGNS: Temp: 98.6 F (37 C) (11/17 2015) Temp Source: Oral (11/17 2015) BP: 110/78 (11/17 2015) Pulse Rate: 121 (11/17 2111)  Recent Labs    01/13/23 1320 01/13/23 2248 01/15/23 0251 01/15/23 1103 01/15/23 2103  HGB  --    < > 11.3*  --   --   HCT  --    < > 36.0  --   --   PLT  --    < > 252  --   --   APTT 30  --   --   --   --   LABPROT 16.8*  --   --   --   --   INR 1.3*  --   --   --   --   HEPARINUNFRC  --    < > 0.43   < > 0.67  CREATININE  --    < > 0.78  --   --   TROPONINIHS 35*  --   --   --   --    < > = values in this interval not displayed.    Estimated Creatinine Clearance: 49.6 mL/min (by C-G formula based on SCr of 0.78 mg/dL).  PAST MEDICAL HISTORY: Past Medical History:  Diagnosis Date   Bedbound    Rheumatoid arthritis of hand (HCC)     ASSESSMENT: 82 y.o. female with PMH rheumatoid arthritis, chronically bedbound is presenting with concerns for multiple PVCs and now impending cardiology workup. cTn mildly elevated at 35. Patient is not on chronic anticoagulation per chart review. Pharmacy has been consulted to initiate and manage heparin intravenous infusion.  Pertinent medications: Aspirin 81 mg PO daily (home med)  Goal(s) of therapy: Heparin level 0.3 - 0.7 units/mL Monitor platelets by anticoagulation protocol: Yes   Baseline anticoagulation labs: Recent Labs    01/13/23 1113 01/13/23 1320 01/14/23 0559 01/15/23 0251  APTT  --  30  --   --   INR  --  1.3*  --   --   HGB 11.9*  --  11.4* 11.3*  PLT 331  --  269 252    Date Time aPTT/HL Rate/Comment 11/15 2248 HL < 0.10 Subtherapeutic 11/16 1034 HL < 0.10 SUBtherapeutic 11/16 1953 HL 0.24 SUBtherapeutic 11/17 0251 HL 0.43 Therapeutic x 1 11/17   1103 HL 0.19 SUBtherapeutic 11/17 2103 HL 0.67 Therapeutic x1     PLAN: Continue heparin infusion at 1500 units/hour. Recheck heparin level in 8 hours, then daily once at least two levels are consecutively therapeutic. Monitor CBC daily while on heparin infusion.  Thank you for involving pharmacy in this patient's care.   Rockwell Alexandria, PharmD Clinical Pharmacist 01/15/2023 9:49 PM

## 2023-01-15 NOTE — Progress Notes (Signed)
  Progress Note   Patient: Lindsay Arnold UJW:[id number] DOB: 07-20-1940 DOA: 01/13/2023     2 DOS: the patient was seen and examined on 01/15/2023   Brief hospital course: Ms. Lindsay Arnold is a 82 year old female with history of rheumatoid arthritis involving multiple sites, on long-term use of immunosuppression medication, chronic bedbound and wheelchair bound state due to severe RA, history of drug-induced leukopenia, B12 deficiency, hypothyroid, who presents to the emergency department from outpatient cardiology clinic for chief concerns of weakness and shortness of breath on exertion. Lab results showed BNP 3311, troponin 35.  Patient is seen by cardiology, started on IV Lasix for exacerbation congestive heart failure. Echocardiogram showed ejection fraction 25 to 30%, grade 2 diastolic dysfunction.  Moderate mitral valve regurgitation. Scheduled for heart cath on Monday.   Principal Problem:   Acute exacerbation of CHF (congestive heart failure) (HCC) Active Problems:   Rheumatoid arthritis involving multiple sites [facility])   Personal history of immunosuppressive therapy   Elevated troponin   Dyspnea on exertion   Weight gain   Wheelchair dependence   Hyponatremia   Assessment and Plan: *Acute on chronic combined systolic and diastolic congestive heart failure Elevated troponin secondary to congestive heart failure with demand ischemia. Patient is clearly volume overload, had a significant weight gain over the last month, significant edema.  Continue IV Lasix, monitor electrolytes. Echocardiogram showed ejection fraction 25 to 30%, with diastolic dysfunction.  Planning for heart cath tomorrow.  Currently on heparin drip.   Paroxysmal atrial fibrillation with RVR. Currently on heparin drip, heart rate better controlled.  Will transition to Eliquis if no additional workup for ischemia is needed.   Hyponatremia. Continue to follow.     Wheelchair dependence Rheumatoid arthritis  involving multiple sites [facility]) Follow-up with PCP as outpatient.        Subjective:  Patient feels better today, short of breath is better.  Physical Exam: Vitals:   01/15/23 0005 01/15/23 0333 01/15/23 0815 01/15/23 1114  BP: 115/69 134/67 118/71 109/78  Pulse:  (!) 108 93 (!) 49  Resp: (!) 22 (!) 22  20  Temp: 97.7 F (36.5 C) 97.6 F (36.4 C) 97.8 F (36.6 C) 98.4 F (36.9 C)  TempSrc: Oral Oral Oral Oral  SpO2: 97% 99% 98% 97%  Weight:      Height:       General exam: Appears calm and comfortable  Respiratory system: Clear to auscultation. Respiratory effort normal. Cardiovascular system: S1 & S2 heard, RRR. No JVD, murmurs, rubs, gallops or clicks.  Gastrointestinal system: Abdomen is nondistended, soft and nontender. No organomegaly or masses felt. Normal bowel sounds heard. Central nervous system: Alert and oriented. No focal neurological deficits. Extremities: 2+ leg edema. Skin: No rashes, lesions or ulcers Psychiatry: Judgement and insight appear normal. Mood & affect appropriate.    Data Reviewed:  Lab results reviewed.  Family Communication: Husband at the bedside.  Disposition: Status is: Inpatient Remains inpatient appropriate because: Severity of disease, IV treatment, inpatient procedure.     Time spent: 35 minutes  Author: Marrion Coy, MD 01/15/2023 11:59 AM  For on call review [email or web address].

## 2023-01-16 ENCOUNTER — Other Ambulatory Visit (HOSPITAL_COMMUNITY): Payer: Self-pay

## 2023-01-16 DIAGNOSIS — I5043 Acute on chronic combined systolic (congestive) and diastolic (congestive) heart failure: Secondary | ICD-10-CM | POA: Diagnosis not present

## 2023-01-16 DIAGNOSIS — I480 Paroxysmal atrial fibrillation: Secondary | ICD-10-CM

## 2023-01-16 DIAGNOSIS — R7989 Other specified abnormal findings of blood chemistry: Secondary | ICD-10-CM | POA: Diagnosis not present

## 2023-01-16 LAB — BASIC METABOLIC PANEL
Anion gap: 11 (ref 5–15)
BUN: 29 mg/dL — ABNORMAL HIGH (ref 8–23)
CO2: 25 mmol/L (ref 22–32)
Calcium: 7.4 mg/dL — ABNORMAL LOW (ref 8.9–10.3)
Chloride: 94 mmol/L — ABNORMAL LOW (ref 98–111)
Creatinine, Ser: 0.71 mg/dL (ref 0.44–1.00)
GFR, Estimated: >60 mL/min (ref >60)
Glucose, Bld: 92 mg/dL (ref 70–99)
Potassium: 4.6 mmol/L (ref 3.5–5.1)
Sodium: 130 mmol/L — ABNORMAL LOW (ref 135–145)

## 2023-01-16 LAB — CBC
HCT: 40.7 % (ref 36.0–46.0)
Hemoglobin: 12.5 g/dL (ref 12.0–15.0)
MCH: 24.7 pg — ABNORMAL LOW (ref 26.0–34.0)
MCHC: 30.7 g/dL (ref 30.0–36.0)
MCV: 80.4 fL (ref 80.0–100.0)
Platelets: 280 10*3/uL (ref 150–400)
RBC: 5.06 MIL/uL (ref 3.87–5.11)
RDW: 19.5 % — ABNORMAL HIGH (ref 11.5–15.5)
WBC: 6 10*3/uL (ref 4.0–10.5)
nRBC: 0 % (ref 0.0–0.2)

## 2023-01-16 LAB — HEPARIN LEVEL (UNFRACTIONATED): Heparin Unfractionated: 0.53 [IU]/mL (ref 0.30–0.70)

## 2023-01-16 MED ORDER — METOPROLOL SUCCINATE ER 25 MG PO TB24
25.0000 mg | ORAL_TABLET | Freq: Two times a day (BID) | ORAL | Status: DC
  Administered 2023-01-16 – 2023-01-18 (×3): 25 mg via ORAL
  Filled 2023-01-16 (×3): qty 1

## 2023-01-16 MED ORDER — FUROSEMIDE 10 MG/ML IJ SOLN
40.0000 mg | Freq: Every day | INTRAMUSCULAR | Status: DC
Start: 2023-01-16 — End: 2023-01-17
  Administered 2023-01-16: 40 mg via INTRAVENOUS
  Filled 2023-01-16: qty 4

## 2023-01-16 NOTE — Consult Note (Signed)
Pharmacy Consult Note - Anticoagulation  Pharmacy Consult for heparin Indication: chest pain/ACS  PATIENT MEASUREMENTS: Height: 5\' 5"  (165.1 cm) Weight: 66.7 kg (147 lb 0.8 oz) IBW/kg (Calculated) : 57 HEPARIN DW (KG): 66.7  VITAL SIGNS: Temp: 97.6 F (36.4 C) (11/18 0324) Temp Source: Oral (11/17 2015) BP: 110/71 (11/18 0324) Pulse Rate: 96 (11/18 0324)  Recent Labs    01/13/23 1320 01/13/23 2248 01/15/23 0251 01/15/23 1103 01/16/23 0443  HGB  --    < > 11.3*  --  12.5  HCT  --    < > 36.0  --  40.7  PLT  --    < > 252  --  280  APTT 30  --   --   --   --   LABPROT 16.8*  --   --   --   --   INR 1.3*  --   --   --   --   HEPARINUNFRC  --    < > 0.43   < > 0.53  CREATININE  --    < > 0.78  --   --   TROPONINIHS 35*  --   --   --   --    < > = values in this interval not displayed.    Estimated Creatinine Clearance: 49.6 mL/min (by C-G formula based on SCr of 0.78 mg/dL).  PAST MEDICAL HISTORY: Past Medical History:  Diagnosis Date   Bedbound    Rheumatoid arthritis of hand (HCC)     ASSESSMENT: 82 y.o. female with PMH rheumatoid arthritis, chronically bedbound is presenting with concerns for multiple PVCs and now impending cardiology workup. cTn mildly elevated at 35. Patient is not on chronic anticoagulation per chart review. Pharmacy has been consulted to initiate and manage heparin intravenous infusion.  Pertinent medications: Aspirin 81 mg PO daily (home med)  Goal(s) of therapy: Heparin level 0.3 - 0.7 units/mL Monitor platelets by anticoagulation protocol: Yes   Baseline anticoagulation labs: Recent Labs    01/13/23 1320 01/14/23 0559 01/15/23 0251 01/16/23 0443  APTT 30  --   --   --   INR 1.3*  --   --   --   HGB  --  11.4* 11.3* 12.5  PLT  --  269 252 280    Date Time aPTT/HL Rate/Comment 11/15 2248 HL < 0.10 Subtherapeutic 11/16 1034 HL < 0.10 SUBtherapeutic 11/16 1953 HL 0.24 SUBtherapeutic 11/17 0251 HL 0.43 Therapeutic x 1 11/17   1103 HL 0.19 SUBtherapeutic 11/17 2103 HL 0.67 Therapeutic x 1 11/18 0443 HL 0.53 Therapeutic x 2     PLAN: Continue heparin infusion at 1500 units/hour. Recheck HL daily w/ AM labs while therapeutic Monitor CBC daily while on heparin infusion.  Thank you for involving pharmacy in this patient's care.   Otelia Sergeant, PharmD, [facility] 01/16/2023 6:29 AM

## 2023-01-16 NOTE — Progress Notes (Signed)
Heart Failure Stewardship Pharmacy Note  PCP: Lynnea Ferrier, MD PCP-Cardiologist: None  HPI: Lindsay Arnold is a 82 y.o. female with severe rheumatoid arthritis being treated with Plaquenil and Cimzia, hypothyroidism who presented with weakness, shortness of breath on exertion and swelling that progressed over the past few weeks.  On admission, BNP was significantly elevated to 4311.9 and HS-troponin was 35. CXR on admission showed pulmonary edema with small bilateral pleural effusions. CTA PE showed the same with no evidence of PE. Echo on 01/14/23 showed LVEF of 25-30% with mdoerately reduced RV function with moderate TR, AR, and MR.  Pertinent Lab Values: Creatinine  Date Value Ref Range Status  10/26/2011 0.50 (L) 0.60 - 1.30 mg/dL Final   Creatinine, Ser  Date Value Ref Range Status  01/16/2023 0.71 0.44 - 1.00 mg/dL Final   BUN  Date Value Ref Range Status  01/16/2023 29 (H) 8 - 23 mg/dL Final  16/11/9602 8 7 - 18 mg/dL Final   Potassium  Date Value Ref Range Status  01/16/2023 4.6 3.5 - 5.1 mmol/L Final  10/26/2011 3.7 3.5 - 5.1 mmol/L Final   Sodium  Date Value Ref Range Status  01/16/2023 130 (L) 135 - 145 mmol/L Final  10/26/2011 141 136 - 145 mmol/L Final   B Natriuretic Peptide  Date Value Ref Range Status  01/13/2023 4,311.9 (H) 0.0 - 100.0 pg/mL Final    Comment:    Performed at [facility], [address] [place], [place] [address]   Magnesium  Date Value Ref Range Status  01/15/2023 2.2 1.7 - 2.4 mg/dL Final    Comment:    Performed at [facility], [address] [place], [place] [address]    Vital Signs: Admission weight: Temp:  [97.6 F (36.4 C)-98.8 F (37.1 C)] 97.6 F (36.4 C) (11/18 0324) Pulse Rate:  [60-121] 107 (11/18 1215) Cardiac Rhythm: Atrial fibrillation (11/18 0700) Resp:  [16-25] 16 (11/18 1215) BP: (105-115)/(67-80) 105/80 (11/18 1215) SpO2:  [98 %-99 %] 98 % (11/18 1215)  Intake/Output Summary  (Last 24 hours) at 01/16/2023 1242 Last data filed at 01/16/2023 1123 Gross per 24 hour  Intake 360 ml  Output 1050 ml  Net -690 ml    Current Heart Failure Medications:  Loop diuretic: furosemide 40 mg IV daily Beta-Blocker: metoprolol succinate 25 mg BID ACEI/ARB/ARNI: none MRA: none SGLT2i: none Other: none  Prior to admission Heart Failure Medications:  Loop diuretic: furosemide 20 mg daily Beta-Blocker: none ACEI/ARB/ARNI: none MRA: none SGLT2i: none Other: none  Assessment: 1. Acute systolic heart failure (LVEF 25-30%) with moderately reduced RV function, due to unknown etiology. NYHA class III symptoms.  -Symptoms: Patient reports shortness of breath and swelling are improving. Reports mild orthopnea. -Volume: Appears to still be hypervolemic. Still with some lower extremity edema. Unable to see JVP. Urine color is clear. Creatinine is stable and BUN is slightly trending up. Reports good urine output on current furosemide dose.  -Hemodynamics: BP stable in 100-110s/80s. Pulse is elevated with AF.  -BB: Metoprolol succinate 25 mg BID added for rate control. -ACEI/ARB/ARNI: BP is currently on the softer side, will monitor for now. -MRA: K is up after replacement, can consider adding pending K trend. -SGLT2i: Recommend adding prior to discharge when purewick is removed. -Cimzia and hydroxychloroquine can both cause/worsen heart failure. Recommend close follow-up and reevaluation of medications for RA outpatient.  Plan: 1) Medication changes recommended at this time: -Discussed with cardiology and recommend continuing IV diuresis today  2) Patient assistance: -Copay for Eliquis, Farxiga, and Jardiance are $47  3) Education: - Patient has been educated on current HF medications and potential additions to HF medication regimen - Patient verbalizes understanding that over the next few months, these medication doses may change and more medications may be added to optimize  HF regimen - Patient has been educated on basic disease state pathophysiology and goals of therapy  Medication Assistance / Insurance Benefits Check: Does the patient have prescription insurance?    Type of insurance plan:  Does the patient qualify for medication assistance through manufacturers or grants? Pending   Outpatient Pharmacy: Prior to admission outpatient pharmacy: [facility]     Please do not hesitate to reach out with questions or concerns,  Enos Fling, PharmD, CPP, BCPS Heart Failure Pharmacist  Phone - [phone] 01/16/2023 2:38 PM

## 2023-01-16 NOTE — Progress Notes (Signed)
[facility] CLINIC CARDIOLOGY PROGRESS NOTE   Patient ID: Lindsay Arnold MRN: [id number] DOB/AGE: 81-Feb-1942 81 y.o.  Admit date: 01/13/2023 Referring Physician Dr. Londell Moh Primary Physician Lynnea Ferrier, MD  Primary Cardiologist Dr. Melton Alar  Reason for Consultation shortness of breath, acute heart failure  HPI: Lindsay Arnold is a 82 y.o. female with a past medical history of severe rheumatoid arthritis, hypothyroidism who presented to the ED on 01/13/2023 for weakness, shortness of breath, abnormal lab work. Cardiology was consulted for further evaluation.   Interval History:  -Patient reports feeling ok today. States she is tired and weak, this is stable. -Denies any active CP or SOB. Reports good UOP with IV lasix and renal function is stable.  -HR elevated at times but overall better controlled on metoprolol. Denies palpitations.   Review of systems complete and found to be negative unless listed above    Vitals:   01/15/23 2015 01/15/23 2100 01/15/23 2111 01/16/23 0324  BP: 110/78   110/71  Pulse:   (!) 121 96  Resp: 20 (!) 22  20  Temp: 98.6 F (37 C)   97.6 F (36.4 C)  TempSrc: Oral     SpO2: 98%   99%  Weight:      Height:         Intake/Output Summary (Last 24 hours) at 01/16/2023 1211 Last data filed at 01/16/2023 1123 Gross per 24 hour  Intake 360 ml  Output 1050 ml  Net -690 ml     PHYSICAL EXAM General: Chronically ill-appearing female, well nourished, in no acute distress laying nearly flat in hospital bed with family present at bedside. HEENT: Normocephalic and atraumatic. Neck: No JVD.  Lungs: Normal respiratory effort on room air. Clear bilaterally to auscultation. No wheezes, crackles, rhonchi.  Heart: Irregularly irregular. Normal S1 and S2 without gallops or murmurs. Radial & DP pulses 2+ bilaterally. Abdomen: Non-distended appearing.  Msk: Normal strength and tone for age. Extremities: No clubbing, cyanosis or edema.   Neuro: Alert and  oriented X 3. Psych: Mood appropriate, affect congruent.    LABS: Basic Metabolic Panel: Recent Labs    01/15/23 0251 01/16/23 0443  NA 130* 130*  K 3.4* 4.6  CL 95* 94*  CO2 26 25  GLUCOSE 104* 92  BUN 25* 29*  CREATININE 0.78 0.71  CALCIUM 7.2* 7.4*  MG 2.2  --    Liver Function Tests: No results for input(s): "AST", "ALT", "ALKPHOS", "BILITOT", "PROT", "ALBUMIN" in the last 72 hours. No results for input(s): "LIPASE", "AMYLASE" in the last 72 hours. CBC: Recent Labs    01/15/23 0251 01/16/23 0443  WBC 6.1 6.0  HGB 11.3* 12.5  HCT 36.0 40.7  MCV 79.3* 80.4  PLT 252 280   Cardiac Enzymes: Recent Labs    01/13/23 1320  TROPONINIHS 35*   BNP: No results for input(s): "BNP" in the last 72 hours. D-Dimer: No results for input(s): "DDIMER" in the last 72 hours. Hemoglobin A1C: No results for input(s): "HGBA1C" in the last 72 hours. Fasting Lipid Panel: No results for input(s): "CHOL", "HDL", "LDLCALC", "TRIG", "CHOLHDL", "LDLDIRECT" in the last 72 hours. Thyroid Function Tests: No results for input(s): "TSH", "T4TOTAL", "T3FREE", "THYROIDAB" in the last 72 hours.  Invalid input(s): "FREET3" Anemia Panel: No results for input(s): "VITAMINB12", "FOLATE", "FERRITIN", "TIBC", "IRON", "RETICCTPCT" in the last 72 hours.  ECHOCARDIOGRAM COMPLETE  Result Date: 01/15/2023    ECHOCARDIOGRAM REPORT   Patient Name:   Lindsay Arnold Date of Exam:  01/14/2023 Medical Rec #:  [id number]       Height:       65.0 in Accession #:    [id number]      Weight:       147.0 lb Date of Birth:  [birth year]        BSA:          1.736 m Patient Age:    82 years        BP:           101/68 mmHg Patient Gender: F               HR:           115 bpm. Exam Location:  [facility] Procedure: 2D Echo, Cardiac Doppler and Color Doppler Indications:     Abnormal ECG R94.31  History:         Patient has no prior history of Echocardiogram examinations.                  CHF; Signs/Symptoms:Dyspnea.  Sonographer:      Lucendia Herrlich RCS Referring Phys:  [id number] AMY N COX Diagnosing Phys: Adrian Blackwater IMPRESSIONS  1. Left ventricular ejection fraction, by estimation, is 25 to 30%. The left ventricle has severely decreased function. The left ventricle demonstrates global hypokinesis. The left ventricular internal cavity size was severely dilated. There is mild left ventricular hypertrophy. Left ventricular diastolic parameters are consistent with Grade II diastolic dysfunction (pseudonormalization).  2. Right ventricular systolic function is moderately reduced. The right ventricular size is moderately enlarged. Mildly increased right ventricular wall thickness.  3. Left atrial size was severely dilated.  4. Right atrial size was severely dilated.  5. The mitral valve is normal in structure. Moderate mitral valve regurgitation. No evidence of mitral stenosis.  6. Tricuspid valve regurgitation is moderate.  7. The aortic valve is normal in structure. Aortic valve regurgitation is moderate. No aortic stenosis is present.  8. The inferior vena cava is normal in size with greater than 50% respiratory variability, suggesting right atrial pressure of 3 mmHg. Conclusion(s)/Recommendation(s): Findings consistent with dilated cardiomyopathy. FINDINGS  Left Ventricle: Left ventricular ejection fraction, by estimation, is 25 to 30%. The left ventricle has severely decreased function. The left ventricle demonstrates global hypokinesis. The left ventricular internal cavity size was severely dilated. There is mild left ventricular hypertrophy. Left ventricular diastolic parameters are consistent with Grade II diastolic dysfunction (pseudonormalization). Right Ventricle: The right ventricular size is moderately enlarged. Mildly increased right ventricular wall thickness. Right ventricular systolic function is moderately reduced. Left Atrium: Left atrial size was severely dilated. Right Atrium: Right atrial size was severely dilated.  Pericardium: There is no evidence of pericardial effusion. Mitral Valve: The mitral valve is normal in structure. Moderate mitral valve regurgitation. No evidence of mitral valve stenosis. Tricuspid Valve: The tricuspid valve is normal in structure. Tricuspid valve regurgitation is moderate . No evidence of tricuspid stenosis. Aortic Valve: The aortic valve is normal in structure. Aortic valve regurgitation is moderate. Aortic regurgitation PHT measures 345 msec. No aortic stenosis is present. Aortic valve peak gradient measures 5.0 mmHg. Pulmonic Valve: The pulmonic valve was normal in structure. Pulmonic valve regurgitation is trivial. No evidence of pulmonic stenosis. Aorta: The aortic root is normal in size and structure. Venous: The inferior vena cava is normal in size with greater than 50% respiratory variability, suggesting right atrial pressure of 3 mmHg. IAS/Shunts: No atrial level shunt detected by color flow Doppler.  LEFT VENTRICLE  PLAX 2D LVIDd:         5.20 cm      Diastology LVIDs:         4.60 cm      LV e' medial:    8.05 cm/s LV PW:         1.00 cm      LV E/e' medial:  9.6 LV IVS:        0.90 cm      LV e' lateral:   12.10 cm/s LVOT diam:     2.10 cm      LV E/e' lateral: 6.4 LV SV:         43 LV SV Index:   25 LVOT Area:     3.46 cm  LV Volumes (MOD) LV vol d, MOD A2C: 149.0 ml LV vol d, MOD A4C: 101.0 ml LV vol s, MOD A2C: 128.0 ml LV vol s, MOD A4C: 77.5 ml LV SV MOD A2C:     21.0 ml LV SV MOD A4C:     101.0 ml LV SV MOD BP:      25.9 ml RIGHT VENTRICLE            IVC RV S prime:     7.62 cm/s  IVC diam: 2.50 cm TAPSE (M-mode): 0.6 cm LEFT ATRIUM             Index        RIGHT ATRIUM           Index LA diam:        3.80 cm 2.19 cm/m   RA Area:     26.50 cm LA Vol (A2C):   54.2 ml 31.23 ml/m  RA Volume:   84.70 ml  48.80 ml/m LA Vol (A4C):   48.1 ml 27.71 ml/m LA Biplane Vol: 53.2 ml 30.65 ml/m  AORTIC VALVE                 PULMONIC VALVE AV Area (Vmax): 2.63 cm     PR End Diast Vel:  3.27 msec AV Vmax:        112.00 cm/s AV Peak Grad:   5.0 mmHg LVOT Vmax:      85.07 cm/s LVOT Vmean:     53.633 cm/s LVOT VTI:       0.124 m AI PHT:         345 msec  AORTA Ao Root diam: 3.60 cm Ao Asc diam:  3.50 cm MITRAL VALVE                  TRICUSPID VALVE MV Area (PHT): 3.40 cm       TR Peak grad:   24.6 mmHg MV Decel Time: 223 msec       TR Vmax:        248.00 cm/s MR Peak grad:    72.5 mmHg MR Mean grad:    58.0 mmHg    SHUNTS MR Vmax:         425.67 cm/s  Systemic VTI:  0.12 m MR Vmean:        363.0 cm/s   Systemic Diam: 2.10 cm MR PISA:         1.01 cm MR PISA Eff ROA: 9 mm MR PISA Radius:  0.40 cm MV E velocity: 77.00 cm/s Adrian Blackwater Electronically signed by Adrian Blackwater Signature Date/Time: 01/15/2023/8:14:37 AM    Final      ECHO as above  TELEMETRY reviewed by me 01/16/23: atrial  fibrillation rate 100-110s PVCs  EKG reviewed by me 01/16/23: atrial fibrillation RVR PVCs rate 121 bpm  DATA reviewed by me 01/16/23: last 24h vitals tele labs imaging I/O, hospitalist progress note  Principal Problem:   Acute exacerbation of CHF (congestive heart failure) (HCC) Active Problems:   Rheumatoid arthritis involving multiple sites (HCC)   Personal history of immunosuppressive therapy   Elevated troponin   Dyspnea on exertion   Weight gain   Wheelchair dependence   Hyponatremia   Acute on chronic combined systolic and diastolic CHF (congestive heart failure) (HCC)   Paroxysmal atrial fibrillation (HCC)    ASSESSMENT AND PLAN: Lindsay Arnold is a 82 y.o. female with a past medical history of severe rheumatoid arthritis, hypothyroidism who presented to the ED on 01/13/2023 for weakness, shortness of breath, abnormal lab work. Cardiology was consulted for further evaluation.   # Acute heart failure reduced EF (EF 25-30% this admission) Patient with worsening SOB, mild orthopnea, LE edema for the last few weeks. BNP significantly elevated this admission at 4300. Trops 35 > 35. EF  reduced at 25-30%, patient denies any hx of heart failure. No prior echo. Started on IV lasix in the ED, net negative 1.2 L.  -Continue IV lasix 40 mg daily.  -Continue metoprolol succinate 25 mg twice daily. Will plan to add GDMT as BP and renal function tolerates after LHC.  -Continue to monitor renal function closely with diuresis.  -Strict I/Os and daily weights.  -Will plan for [facility] tomorrow for additional evaluation of new cardiomyopathy. Procedure discussed in detail with patient and family, patient is amenable to proceeding understanding risks/benefits.   # Atrial fibrillation RVR # New onset atrial fibrillation Patient found to be in AF RVR on admission, denies any prior history. Started on metoprolol for rate control.  -Continue heparin infusion. Will transition to DOAC after LHC.  -Continue metoprolol 25 mg twice daily for rate control.  -Consider rhythm control options prior to discharge pending results of LHC.   # Rheumatoid arthritis Patient with longstanding hx of rheumatoid arthritis s/p multiple therapies now wheelchair bound.  -Management per primary.   This patient's case was discussed and created with Dr. Darrold Junker and he is in agreement.  Signed:  Gale Journey, PA-C  01/16/2023, 12:11 PM [facility] Cardiology

## 2023-01-16 NOTE — Progress Notes (Signed)
  Progress Note   Patient: Lindsay Arnold ZOX:[id number] DOB: Feb 27, 1941 DOA: 01/13/2023     3 DOS: the patient was seen and examined on 01/16/2023   Brief hospital course: Ms. Janiecia Sodders is a 82 year old female with history of rheumatoid arthritis involving multiple sites, on long-term use of immunosuppression medication, chronic bedbound and wheelchair bound state due to severe RA, history of drug-induced leukopenia, B12 deficiency, hypothyroid, who presents to the emergency department from outpatient cardiology clinic for chief concerns of weakness and shortness of breath on exertion. Lab results showed BNP 3311, troponin 35.  Patient is seen by cardiology, started on IV Lasix for exacerbation congestive heart failure. Echocardiogram showed ejection fraction 25 to 30%, grade 2 diastolic dysfunction.  Moderate mitral valve regurgitation. Heart cath rescheduled on Tuesday.   Principal Problem:   Acute exacerbation of CHF (congestive heart failure) (HCC) Active Problems:   Rheumatoid arthritis involving multiple sites [facility])   Personal history of immunosuppressive therapy   Elevated troponin   Dyspnea on exertion   Weight gain   Wheelchair dependence   Hyponatremia   Acute on chronic combined systolic and diastolic CHF (congestive heart failure) (HCC)   Assessment and Plan:  *Acute on chronic combined systolic and diastolic congestive heart failure Elevated troponin secondary to congestive heart failure with demand ischemia. Patient is clearly volume overload, had a significant weight gain over the last month, significant edema.  Continue IV Lasix, monitor electrolytes. Echocardiogram showed ejection fraction 25 to 30%, with diastolic dysfunction.  Followed by cardiology, on heparin drip.  Volume status better, still on IV Lasix at a lower dose.  Heart cath rescheduled on Tuesday.  Continue monitor electrolytes.   Paroxysmal atrial fibrillation with RVR. Currently on heparin drip,  heart rate better controlled.     Hyponatremia. Continue to follow.     Wheelchair dependence Rheumatoid arthritis involving multiple sites [facility]) Follow-up with PCP as outpatient.  Patient does not wish to go to nursing home.     Subjective:  Patient doing well today, no short of breath, no chest pain.  Physical Exam: Vitals:   01/15/23 2015 01/15/23 2100 01/15/23 2111 01/16/23 0324  BP: 110/78   110/71  Pulse:   (!) 121 96  Resp: 20 (!) 22  20  Temp: 98.6 F (37 C)   97.6 F (36.4 C)  TempSrc: Oral     SpO2: 98%   99%  Weight:      Height:       General exam: Appears calm and comfortable  Respiratory system: Clear to auscultation. Respiratory effort normal. Cardiovascular system: S1 & S2 heard, RRR. No JVD, murmurs, rubs, gallops or clicks. No pedal edema. Gastrointestinal system: Abdomen is nondistended, soft and nontender. No organomegaly or masses felt. Normal bowel sounds heard. Central nervous system: Alert and oriented. No focal neurological deficits. Extremities: Symmetric 5 x 5 power. Skin: No rashes, lesions or ulcers Psychiatry: Judgement and insight appear normal. Mood & affect appropriate.    Data Reviewed:  Lab results reviewed.  Family Communication: Husband and son updated in the room.  Disposition: Status is: Inpatient Remains inpatient appropriate because: Severity of disease, IV treatment, inpatient procedure.     Time spent: 35 minutes  Author: Marrion Coy, MD 01/16/2023 10:18 AM  For on call review [email or web address].

## 2023-01-16 NOTE — TOC Benefit Eligibility Note (Addendum)
Patient Product/process development scientist completed.    The patient is insured through [facility]. Patient has Medicare and is not eligible for a copay card, but may be able to apply for patient assistance, if available.    Ran test claim for Eliquis 5 mg and the current 30 day co-pay is $47.00.  Ran test claim for Farxiga 10 mg and the current 30 day co-pay is $47.00.  Ran test claim for Jardiance 10 mg and the current 30 day co-pay is $47.00.  This test claim was processed through [facility]- copay amounts may vary at other pharmacies due to pharmacy/plan contracts, or as the patient moves through the different stages of their insurance plan.     Roland Earl, CPHT Pharmacy Technician III Certified Patient Advocate [facility] Pharmacy Patient Advocate Team Direct Number: [phone]  Fax: [phone]

## 2023-01-16 NOTE — TOC Progression Note (Signed)
Transition of Care [facility]) - Progression Note    Patient Details  Name: Lindsay Arnold MRN: [id number] Date of Birth: 1940-12-18  Transition of Care [facility]) CM/SW Contact  Truddie Hidden, RN Phone Number: 01/16/2023, 11:56 AM  Clinical Narrative:    TOC continuing to follow patient's progress throughout discharge planning.          Expected Discharge Plan and Services                                               Social Determinants of Health (SDOH) Interventions SDOH Screenings   Food Insecurity: No Food Insecurity (01/13/2023)  Housing: Low Risk  (01/13/2023)  Transportation Needs: No Transportation Needs (01/13/2023)  Utilities: Not At Risk (01/13/2023)  Financial Resource Strain: Low Risk  (01/09/2023)   Received from [facility]  Tobacco Use: Low Risk  (01/13/2023)    Readmission Risk Interventions     No data to display

## 2023-01-16 NOTE — TOC Progression Note (Addendum)
Transition of Care [facility]) - Progression Note    Patient Details  Name: Lindsay Arnold MRN: [id number] Date of Birth: 05/05/1940  Transition of Care [facility]) CM/SW Contact  Truddie Hidden, RN Phone Number: 01/16/2023, 4:02 PM  Clinical Narrative:    Spoke with patient's son. He had concerns with patient returning home. Patient is bedbound and assisted by her spouse. RNCM discussed discharge plans. Patient son feels patient may benefit from more assistance in the home. He was advised about SNF vs LTC placements. He was advised  patient needs have to be skilled for insurance to pay for [facility] or SNF.         Expected Discharge Plan and Services                                               Social Determinants of Health (SDOH) Interventions SDOH Screenings   Food Insecurity: No Food Insecurity (01/13/2023)  Housing: Low Risk  (01/13/2023)  Transportation Needs: No Transportation Needs (01/13/2023)  Utilities: Not At Risk (01/13/2023)  Financial Resource Strain: Low Risk  (01/09/2023)   Received from [facility]  Tobacco Use: Low Risk  (01/13/2023)    Readmission Risk Interventions     No data to display

## 2023-01-17 ENCOUNTER — Encounter: Payer: Self-pay | Admitting: Cardiology

## 2023-01-17 ENCOUNTER — Encounter
Admission: EM | Disposition: A | Discharge status: Skilled Nursing Facility | Payer: Self-pay | Source: Ambulatory Visit | Attending: Internal Medicine

## 2023-01-17 DIAGNOSIS — R7989 Other specified abnormal findings of blood chemistry: Secondary | ICD-10-CM | POA: Diagnosis not present

## 2023-01-17 DIAGNOSIS — I5043 Acute on chronic combined systolic (congestive) and diastolic (congestive) heart failure: Secondary | ICD-10-CM | POA: Diagnosis not present

## 2023-01-17 DIAGNOSIS — I480 Paroxysmal atrial fibrillation: Secondary | ICD-10-CM | POA: Diagnosis not present

## 2023-01-17 HISTORY — PX: LEFT HEART CATH AND CORONARY ANGIOGRAPHY: CATH118249

## 2023-01-17 HISTORY — PX: RIGHT AND LEFT HEART CATH: CATH118262

## 2023-01-17 LAB — POCT I-STAT 7, (LYTES, BLD GAS, ICA,H+H)
Acid-Base Excess: 4 mmol/L — ABNORMAL HIGH (ref 0.0–2.0)
Bicarbonate: 26.9 mmol/L (ref 20.0–28.0)
Calcium, Ion: 0.91 mmol/L — ABNORMAL LOW (ref 1.15–1.40)
HCT: 37 % (ref 36.0–46.0)
Hemoglobin: 12.6 g/dL (ref 12.0–15.0)
O2 Saturation: 96 %
Potassium: 3.7 mmol/L (ref 3.5–5.1)
Sodium: 133 mmol/L — ABNORMAL LOW (ref 135–145)
TCO2: 28 mmol/L (ref 22–32)
pCO2 arterial: 35.1 mm[Hg] (ref 32–48)
pH, Arterial: 7.493 — ABNORMAL HIGH (ref 7.35–7.45)
pO2, Arterial: 75 mm[Hg] — ABNORMAL LOW (ref 83–108)

## 2023-01-17 LAB — BASIC METABOLIC PANEL
Anion gap: 8 (ref 5–15)
BUN: 28 mg/dL — ABNORMAL HIGH (ref 8–23)
CO2: 28 mmol/L (ref 22–32)
Calcium: 7.4 mg/dL — ABNORMAL LOW (ref 8.9–10.3)
Chloride: 95 mmol/L — ABNORMAL LOW (ref 98–111)
Creatinine, Ser: 0.77 mg/dL (ref 0.44–1.00)
GFR, Estimated: >60 mL/min (ref >60)
Glucose, Bld: 100 mg/dL — ABNORMAL HIGH (ref 70–99)
Potassium: 3.9 mmol/L (ref 3.5–5.1)
Sodium: 131 mmol/L — ABNORMAL LOW (ref 135–145)

## 2023-01-17 LAB — POCT I-STAT EG7
Acid-Base Excess: 3 mmol/L — ABNORMAL HIGH (ref 0.0–2.0)
Bicarbonate: 28.4 mmol/L — ABNORMAL HIGH (ref 20.0–28.0)
Calcium, Ion: 0.95 mmol/L — ABNORMAL LOW (ref 1.15–1.40)
HCT: 40 % (ref 36.0–46.0)
Hemoglobin: 13.6 g/dL (ref 12.0–15.0)
O2 Saturation: 60 %
Potassium: 3.8 mmol/L (ref 3.5–5.1)
Sodium: 133 mmol/L — ABNORMAL LOW (ref 135–145)
TCO2: 30 mmol/L (ref 22–32)
pCO2, Ven: 43.2 mm[Hg] — ABNORMAL LOW (ref 44–60)
pH, Ven: 7.426 (ref 7.25–7.43)
pO2, Ven: 31 mm[Hg] — CL (ref 32–45)

## 2023-01-17 LAB — HEPARIN LEVEL (UNFRACTIONATED): Heparin Unfractionated: 0.56 [IU]/mL (ref 0.30–0.70)

## 2023-01-17 LAB — CBC
HCT: 39 % (ref 36.0–46.0)
Hemoglobin: 12.2 g/dL (ref 12.0–15.0)
MCH: 24.4 pg — ABNORMAL LOW (ref 26.0–34.0)
MCHC: 31.3 g/dL (ref 30.0–36.0)
MCV: 77.8 fL — ABNORMAL LOW (ref 80.0–100.0)
Platelets: 281 10*3/uL (ref 150–400)
RBC: 5.01 MIL/uL (ref 3.87–5.11)
RDW: 19.7 % — ABNORMAL HIGH (ref 11.5–15.5)
WBC: 7 10*3/uL (ref 4.0–10.5)
nRBC: 0 % (ref 0.0–0.2)

## 2023-01-17 LAB — MAGNESIUM: Magnesium: 2.2 mg/dL (ref 1.7–2.4)

## 2023-01-17 SURGERY — LEFT HEART CATH AND CORONARY ANGIOGRAPHY
Anesthesia: Moderate Sedation

## 2023-01-17 MED ORDER — MIDAZOLAM HCL 2 MG/2ML IJ SOLN
INTRAMUSCULAR | Status: DC | PRN
  Administered 2023-01-17: .5 mg via INTRAVENOUS

## 2023-01-17 MED ORDER — VERAPAMIL HCL 2.5 MG/ML IV SOLN
INTRAVENOUS | Status: AC
  Filled 2023-01-17: qty 2

## 2023-01-17 MED ORDER — SODIUM CHLORIDE 0.9 % WEIGHT BASED INFUSION
3.0000 mL/kg/h | INTRAVENOUS | Status: DC
  Administered 2023-01-17: 3 mL/kg/h via INTRAVENOUS

## 2023-01-17 MED ORDER — HEPARIN (PORCINE) IN NACL 2000-0.9 UNIT/L-% IV SOLN
INTRAVENOUS | Status: DC | PRN
  Administered 2023-01-17: 1000 mL

## 2023-01-17 MED ORDER — IOHEXOL 300 MG/ML  SOLN
INTRAMUSCULAR | Status: DC | PRN
  Administered 2023-01-17: 90 mL

## 2023-01-17 MED ORDER — FLECAINIDE ACETATE 50 MG PO TABS
50.0000 mg | ORAL_TABLET | Freq: Two times a day (BID) | ORAL | Status: DC
  Administered 2023-01-17 – 2023-01-18 (×2): 50 mg via ORAL
  Filled 2023-01-17 (×2): qty 1

## 2023-01-17 MED ORDER — ASPIRIN 81 MG PO TBEC: 81.0000 mg | DELAYED_RELEASE_TABLET | Freq: Once | ORAL | Status: DC

## 2023-01-17 MED ORDER — FENTANYL CITRATE (PF) 100 MCG/2ML IJ SOLN
INTRAMUSCULAR | Status: AC
  Filled 2023-01-17: qty 2

## 2023-01-17 MED ORDER — FENTANYL CITRATE (PF) 100 MCG/2ML IJ SOLN
INTRAMUSCULAR | Status: DC | PRN
  Administered 2023-01-17: 25 ug via INTRAVENOUS

## 2023-01-17 MED ORDER — VERAPAMIL HCL 2.5 MG/ML IV SOLN
INTRAVENOUS | Status: DC | PRN
  Administered 2023-01-17 (×2): 2.5 mg via INTRA_ARTERIAL

## 2023-01-17 MED ORDER — APIXABAN 5 MG PO TABS
5.0000 mg | ORAL_TABLET | Freq: Two times a day (BID) | ORAL | Status: DC
  Administered 2023-01-18 – 2023-01-23 (×11): 5 mg via ORAL
  Filled 2023-01-17 (×12): qty 1

## 2023-01-17 MED ORDER — HEPARIN SODIUM (PORCINE) 1000 UNIT/ML IJ SOLN
INTRAMUSCULAR | Status: DC | PRN
  Administered 2023-01-17: 3000 [IU] via INTRAVENOUS

## 2023-01-17 MED ORDER — ASPIRIN 81 MG PO CHEW
81.0000 mg | CHEWABLE_TABLET | Freq: Once | ORAL | Status: AC
  Administered 2023-01-17: 81 mg via ORAL

## 2023-01-17 MED ORDER — HEPARIN SODIUM (PORCINE) 1000 UNIT/ML IJ SOLN
INTRAMUSCULAR | Status: AC
  Filled 2023-01-17: qty 10

## 2023-01-17 MED ORDER — ASPIRIN 81 MG PO CHEW
CHEWABLE_TABLET | ORAL | Status: AC
  Filled 2023-01-17: qty 1

## 2023-01-17 MED ORDER — CLOPIDOGREL BISULFATE 75 MG PO TABS: 75.0000 mg | ORAL_TABLET | Freq: Every day | ORAL | Status: DC

## 2023-01-17 MED ORDER — ASPIRIN 81 MG PO TBEC: 81.0000 mg | DELAYED_RELEASE_TABLET | Freq: Every day | ORAL | Status: DC

## 2023-01-17 MED ORDER — FUROSEMIDE 10 MG/ML IJ SOLN
40.0000 mg | Freq: Every day | INTRAMUSCULAR | Status: AC
  Administered 2023-01-18: 40 mg via INTRAVENOUS
  Filled 2023-01-17: qty 4

## 2023-01-17 MED ORDER — OYSTER SHELL CALCIUM/D3 500-5 MG-MCG PO TABS
1.0000 | ORAL_TABLET | Freq: Two times a day (BID) | ORAL | Status: DC
  Administered 2023-01-17 – 2023-01-23 (×12): 1 via ORAL
  Filled 2023-01-17 (×12): qty 1

## 2023-01-17 MED ORDER — HEPARIN (PORCINE) IN NACL 1000-0.9 UT/500ML-% IV SOLN
INTRAVENOUS | Status: AC
Start: 2023-01-17 — End: ?
  Filled 2023-01-17: qty 1000

## 2023-01-17 MED ORDER — MIDAZOLAM HCL 2 MG/2ML IJ SOLN
INTRAMUSCULAR | Status: AC
Start: 2023-01-17 — End: ?
  Filled 2023-01-17: qty 2

## 2023-01-17 MED ORDER — SODIUM CHLORIDE 0.9 % WEIGHT BASED INFUSION: 1.0000 mL/kg/h | INTRAVENOUS | Status: DC

## 2023-01-17 SURGICAL SUPPLY — 17 items
CATH 5FR JL3.5 JR4 ANG PIG MP (CATHETERS) IMPLANT
CATH BALLN WEDGE 5F 110CM (CATHETERS) IMPLANT
DEVICE RAD TR BAND REGULAR (VASCULAR PRODUCTS) IMPLANT
DRAPE BRACHIAL (DRAPES) IMPLANT
GLIDESHEATH SLEND SS 6F .021 (SHEATH) IMPLANT
GUIDEWIRE .025 260CM (WIRE) IMPLANT
GUIDEWIRE EMER 3M J .025X150CM (WIRE) IMPLANT
GUIDEWIRE INQWIRE 1.5J.035X260 (WIRE) IMPLANT
INQWIRE 1.5J .035X260CM (WIRE) ×1
KIT ESSENTIALS PG (KITS) IMPLANT
PACK CARDIAC CATH (CUSTOM PROCEDURE TRAY) ×1 IMPLANT
PROTECTION STATION PRESSURIZED (MISCELLANEOUS) ×1
SET ATX-X65L (MISCELLANEOUS) IMPLANT
SHEATH GLIDE SLENDER 4/5FR (SHEATH) IMPLANT
STATION PROTECTION PRESSURIZED (MISCELLANEOUS) IMPLANT
WIRE G HI TQ BMW 190 (WIRE) IMPLANT
WIRE HITORQ VERSACORE ST 145CM (WIRE) IMPLANT

## 2023-01-17 NOTE — Consult Note (Signed)
Pharmacy Consult Note - Anticoagulation  Pharmacy Consult for heparin Indication: chest pain/ACS  PATIENT MEASUREMENTS: Height: 5\' 5"  (165.1 cm) Weight: 68.1 kg (150 lb 2.1 oz) IBW/kg (Calculated) : 57 HEPARIN DW (KG): 66.7  VITAL SIGNS: Temp: 97.4 F (36.3 C) (11/19 0424) Temp Source: Oral (11/18 1942) BP: 117/88 (11/19 0424) Pulse Rate: 84 (11/19 0424)  Recent Labs    01/17/23 0504  HGB 12.2  HCT 39.0  PLT 281  HEPARINUNFRC 0.56  CREATININE 0.77    Estimated Creatinine Clearance: 49.6 mL/min (by C-G formula based on SCr of 0.77 mg/dL).  PAST MEDICAL HISTORY: Past Medical History:  Diagnosis Date   Bedbound    Rheumatoid arthritis of hand (HCC)     ASSESSMENT: 82 y.o. female with PMH rheumatoid arthritis, chronically bedbound is presenting with concerns for multiple PVCs and now impending cardiology workup. cTn mildly elevated at 35. Patient is not on chronic anticoagulation per chart review. Pharmacy has been consulted to initiate and manage heparin intravenous infusion.  Pertinent medications: Aspirin 81 mg PO daily (home med)  Goal(s) of therapy: Heparin level 0.3 - 0.7 units/mL Monitor platelets by anticoagulation protocol: Yes   Baseline anticoagulation labs: Recent Labs    01/15/23 0251 01/16/23 0443 01/17/23 0504  HGB 11.3* 12.5 12.2  PLT 252 280 281    Date Time aPTT/HL Rate/Comment 11/15 2248 HL < 0.10 Subtherapeutic 11/16 1034 HL < 0.10 SUBtherapeutic 11/16 1953 HL 0.24 SUBtherapeutic 11/17 0251 HL 0.43 Therapeutic x 1 11/17  1103 HL 0.19 SUBtherapeutic 11/17 2103 HL 0.67 Therapeutic x 1 11/18 0443 HL 0.53 Therapeutic x 2 11/19   0504    HL 0.56           Therapeutic X 3      PLAN: Continue heparin infusion at 1500 units/hour. Recheck HL daily w/ AM labs while therapeutic Monitor CBC daily while on heparin infusion.  Thank you for involving pharmacy in this patient's care.   Arien Benincasa D 01/17/2023 6:13 AM

## 2023-01-17 NOTE — Progress Notes (Incomplete)
Heart Failure Stewardship Pharmacy Note  PCP: Lynnea Ferrier, MD PCP-Cardiologist: None  HPI: Lindsay Arnold is a 82 y.o. female with severe rheumatoid arthritis being treated with Plaquenil and Cimzia, hypothyroidism who presented with weakness, shortness of breath on exertion and swelling that progressed over the past few weeks.  On admission, BNP was significantly elevated to 4311.9 and HS-troponin was 35. CXR on admission showed pulmonary edema with small bilateral pleural effusions. CTA PE showed the same with no evidence of PE. Echo on 01/14/23 showed LVEF of 25-30% with mdoerately reduced RV function with moderate TR, AR, and MR.  Pertinent Lab Values: Creatinine  Date Value Ref Range Status  10/26/2011 0.50 (L) 0.60 - 1.30 mg/dL Final   Creatinine, Ser  Date Value Ref Range Status  01/17/2023 0.77 0.44 - 1.00 mg/dL Final   BUN  Date Value Ref Range Status  01/17/2023 28 (H) 8 - 23 mg/dL Final  30/86/5784 8 7 - 18 mg/dL Final   Potassium  Date Value Ref Range Status  01/17/2023 3.9 3.5 - 5.1 mmol/L Final  10/26/2011 3.7 3.5 - 5.1 mmol/L Final   Sodium  Date Value Ref Range Status  01/17/2023 131 (L) 135 - 145 mmol/L Final  10/26/2011 141 136 - 145 mmol/L Final   B Natriuretic Peptide  Date Value Ref Range Status  01/13/2023 4,311.9 (H) 0.0 - 100.0 pg/mL Final    Comment:    Performed at [facility], [address] [place], [place] [address]   Magnesium  Date Value Ref Range Status  01/17/2023 2.2 1.7 - 2.4 mg/dL Final    Comment:    Performed at [facility], [address] [place], [place] [address]    Vital Signs: Admission weight: Temp:  [97.4 F (36.3 C)-98.5 F (36.9 C)] 97.4 F (36.3 C) (11/19 0424) Pulse Rate:  [66-107] 84 (11/19 0424) Cardiac Rhythm: Atrial fibrillation (11/18 2155) Resp:  [15-20] 18 (11/19 0424) BP: (105-117)/(67-88) 117/88 (11/19 0424) SpO2:  [98 %-100 %] 100 % (11/19 0424) Weight:  [68.1 kg (150 lb  2.1 oz)] 68.1 kg (150 lb 2.1 oz) (11/19 0437)  Intake/Output Summary (Last 24 hours) at 01/17/2023 0743 Last data filed at 01/17/2023 0431 Gross per 24 hour  Intake 360 ml  Output 2350 ml  Net -1990 ml    Current Heart Failure Medications:  Loop diuretic: furosemide 40 mg IV daily Beta-Blocker: metoprolol succinate 25 mg BID ACEI/ARB/ARNI: none MRA: none SGLT2i: none Other: none  Prior to admission Heart Failure Medications:  Loop diuretic: furosemide 20 mg daily Beta-Blocker: none ACEI/ARB/ARNI: none MRA: none SGLT2i: none Other: none  Assessment: 1. Acute systolic heart failure (LVEF 25-30%) with moderately reduced RV function, due to unknown etiology. NYHA class III symptoms.  -Symptoms: Patient reports shortness of breath and swelling are improving. Reports mild orthopnea. -Volume: Appears to still be hypervolemic. Still with some lower extremity edema. Unable to see JVP. Urine color is clear. Creatinine is stable and BUN is slightly trending up. Reports good urine output on current furosemide dose.  -Hemodynamics: BP stable in 100-110s/80s. Pulse is elevated with AF.  -BB: Metoprolol succinate 25 mg BID added for rate control. -ACEI/ARB/ARNI: BP is currently on the softer side, will monitor for now. -MRA: K is up after replacement, can consider adding pending K trend. -SGLT2i: Recommend adding prior to discharge when purewick is removed. -Cimzia and hydroxychloroquine can both cause/worsen heart failure. Recommend close follow-up and reevaluation of medications for RA outpatient.  Plan:  1) Medication changes recommended at this time: -Discussed with cardiology and recommend continuing IV diuresis today  2) Patient assistance: -Copay for Eliquis, Farxiga, and Jardiance are $47  3) Education: - Patient has been educated on current HF medications and potential additions to HF medication regimen - Patient verbalizes understanding that over the next few months, these  medication doses may change and more medications may be added to optimize HF regimen - Patient has been educated on basic disease state pathophysiology and goals of therapy  Medication Assistance / Insurance Benefits Check: Does the patient have prescription insurance?    Type of insurance plan:  Does the patient qualify for medication assistance through manufacturers or grants? Pending   Outpatient Pharmacy: Prior to admission outpatient pharmacy: [facility]     Please do not hesitate to reach out with questions or concerns,  Enos Fling, PharmD, CPP, BCPS Heart Failure Pharmacist  Phone - [phone] 01/17/2023 7:43 AM

## 2023-01-17 NOTE — TOC Progression Note (Signed)
Transition of Care [facility]) - Progression Note    Patient Details  Name: Lindsay Arnold MRN: [id number] Date of Birth: 15-Jun-1940  [facility]) CM/SW Contact  Truddie Hidden, RN Phone Number: 01/17/2023, 3:42 PM  Clinical Narrative:    Sherron Monday with Minerva Areola, patient's son regarding discharge plans. PT/ OT eval are pending completion. RNCM explained process for obtaining a SNF bed and approvals. Minerva Areola inquired about a hospital bed at discharge. RNCM advised discharges are targeted for next level of care. Minerva Areola was informed if a hospital bed is needed post SNF discharge the facility will coordinate that and in addition to  [facility] care. He was also advised HH care and DME would be arranged by TOC  if that level of care is indicated post therapy assessment for any post discharge needs. Minerva Areola inquired about home care. RNCM explained HH vs home care. He has been advised home care would be private pay and would be coordinated by family or advocates. Minerva Areola verbalized his understanding and denied any further questions at this time.         Expected Discharge Plan and Services                                               Social Determinants of Health (SDOH) Interventions SDOH Screenings   Food Insecurity: No Food Insecurity (01/13/2023)  Housing: Low Risk  (01/13/2023)  Transportation Needs: No Transportation Needs (01/13/2023)  Utilities: Not At Risk (01/13/2023)  Financial Resource Strain: Low Risk  (01/09/2023)   Received from [facility]  Tobacco Use: Low Risk  (01/13/2023)    Readmission Risk Interventions     No data to display

## 2023-01-17 NOTE — Progress Notes (Signed)
  Progress Note   Patient: Lindsay Arnold AOZ:[id number] DOB: 1940/11/09 DOA: 01/13/2023     4 DOS: the patient was seen and examined on 01/17/2023   Brief hospital course: Ms. Gladis Henion is a 82 year old female with history of rheumatoid arthritis involving multiple sites, on long-term use of immunosuppression medication, chronic bedbound and wheelchair bound state due to severe RA, history of drug-induced leukopenia, B12 deficiency, hypothyroid, who presents to the emergency department from outpatient cardiology clinic for chief concerns of weakness and shortness of breath on exertion. Lab results showed BNP 3311, troponin 35.  Patient is seen by cardiology, started on IV Lasix for exacerbation congestive heart failure. Echocardiogram showed ejection fraction 25 to 30%, grade 2 diastolic dysfunction.  Moderate mitral valve regurgitation. Heart cath performed on 11/19, showed near normal coronary arteries.   Principal Problem:   Acute exacerbation of CHF (congestive heart failure) (HCC) Active Problems:   Rheumatoid arthritis involving multiple sites [facility])   Personal history of immunosuppressive therapy   Elevated troponin   Dyspnea on exertion   Weight gain   Wheelchair dependence   Hyponatremia   Acute on chronic combined systolic and diastolic CHF (congestive heart failure) (HCC)   Paroxysmal atrial fibrillation (HCC)   Assessment and Plan:  *Acute on chronic combined systolic and diastolic congestive heart failure Elevated troponin secondary to congestive heart failure with demand ischemia. Patient is clearly volume overload, had a significant weight gain over the last month, significant edema.  Continue IV Lasix, monitor electrolytes. Echocardiogram showed ejection fraction 25 to 30%, with diastolic dysfunction.  Followed by cardiology, on heparin drip.  Volume status better, still on IV Lasix at 40 mg daily. Heart catheter performed on 11/19 showed near normal coronary  arteries. 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   Paroxysmal atrial fibrillation with RVR. Eliquis started, patient was also treated with flecainide.  Continue metoprolol.   Hyponatremia. Stable.    Wheelchair dependence Rheumatoid arthritis involving multiple sites [facility]) Follow-up with PCP as outpatient.  Patient does not wish to go to nursing home. Continue prednisone chronically.  Hypocalcemia. Start calcium with vitamin D.    Subjective:  Patient doing better, no chest pain.  Short of breath has improved.  Physical Exam: Vitals:   01/17/23 1215 01/17/23 1230 01/17/23 1240 01/17/23 1250  BP:  (!) 88/51 (!) 114/99 111/85  Pulse: 98 (!) 105    Resp: 20 (!) 24    Temp:      TempSrc:      SpO2: 97% 97%    Weight:      Height:       General exam: Appears calm and comfortable  Respiratory system: Clear to auscultation. Respiratory effort normal. Cardiovascular system: Irregular. No JVD, murmurs, rubs, gallops or clicks. No pedal edema. Gastrointestinal system: Abdomen is nondistended, soft and nontender. No organomegaly or masses felt. Normal bowel sounds heard. Central nervous system: Alert and oriented. No focal neurological deficits. Extremities: Symmetric 5 x 5 power. Skin: No rashes, lesions or ulcers Psychiatry: Judgement and insight appear normal. Mood & affect appropriate.    Data Reviewed:  Lab results reviewed.  Family Communication: Husband and son updated at bedside.  Disposition: Status is: Inpatient Remains inpatient appropriate because: Severity of disease, IV treatment.     Time spent: 35 minutes  Author: Marrion Coy, MD 01/17/2023 2:59 PM  For on call review [email or web address].

## 2023-01-17 NOTE — Progress Notes (Signed)
[facility] CLINIC CARDIOLOGY PROGRESS NOTE   Patient ID: Lindsay Arnold MRN: [id number] DOB/AGE: 09/04/1940 82 y.o.  Admit date: 01/13/2023 Referring Physician Dr. Londell Moh Primary Physician Lynnea Ferrier, MD  Primary Cardiologist Dr. Melton Alar  Reason for Consultation shortness of breath, acute heart failure  HPI: Lindsay Arnold is a 82 y.o. female with a past medical history of severe rheumatoid arthritis, hypothyroidism who presented to the ED on 01/13/2023 for weakness, shortness of breath, abnormal lab work. Cardiology was consulted for further evaluation.   Interval History:  -Patient seen and examined this afternoon after LHC. Feeling well, no complaints.  -Denies any CP or palpitations. HR better controlled today.  -Denies SOB, LE edema improved. BP remains borderline.   Review of systems complete and found to be negative unless listed above    Vitals:   01/17/23 1215 01/17/23 1230 01/17/23 1240 01/17/23 1250  BP:  (!) 88/51 (!) 114/99 111/85  Pulse: 98 (!) 105    Resp: 20 (!) 24    Temp:      TempSrc:      SpO2: 97% 97%    Weight:      Height:         Intake/Output Summary (Last 24 hours) at 01/17/2023 1336 Last data filed at 01/17/2023 0431 Gross per 24 hour  Intake 120 ml  Output 1700 ml  Net -1580 ml     PHYSICAL EXAM General: Chronically ill-appearing female, well nourished, in no acute distress laying nearly flat in hospital bed with family present at bedside. HEENT: Normocephalic and atraumatic. Neck: No JVD.  Lungs: Normal respiratory effort on room air. Clear bilaterally to auscultation. No wheezes, crackles, rhonchi.  Heart: Irregularly irregular. Normal S1 and S2 without gallops or murmurs. Radial & DP pulses 2+ bilaterally. Abdomen: Non-distended appearing.  Msk: Normal strength and tone for age. Extremities: 1 + pitting edema bilaterally.  Neuro: Alert and oriented X 3. Psych: Mood appropriate, affect congruent.    LABS: Basic Metabolic  Panel: Recent Labs    01/15/23 0251 01/16/23 0443 01/17/23 0504 01/17/23 0945 01/17/23 0952  NA 130* 130* 131* 133* 133*  K 3.4* 4.6 3.9 3.8 3.7  CL 95* 94* 95*  --   --   CO2 26 25 28   --   --   GLUCOSE 104* 92 100*  --   --   BUN 25* 29* 28*  --   --   CREATININE 0.78 0.71 0.77  --   --   CALCIUM 7.2* 7.4* 7.4*  --   --   MG 2.2  --  2.2  --   --    Liver Function Tests: No results for input(s): "AST", "ALT", "ALKPHOS", "BILITOT", "PROT", "ALBUMIN" in the last 72 hours. No results for input(s): "LIPASE", "AMYLASE" in the last 72 hours. CBC: Recent Labs    01/16/23 0443 01/17/23 0504 01/17/23 0945 01/17/23 0952  WBC 6.0 7.0  --   --   HGB 12.5 12.2 13.6 12.6  HCT 40.7 39.0 40.0 37.0  MCV 80.4 77.8*  --   --   PLT 280 281  --   --    Cardiac Enzymes: No results for input(s): "CKTOTAL", "CKMB", "CKMBINDEX", "TROPONINIHS" in the last 72 hours.  BNP: No results for input(s): "BNP" in the last 72 hours. D-Dimer: No results for input(s): "DDIMER" in the last 72 hours. Hemoglobin A1C: No results for input(s): "HGBA1C" in the last 72 hours. Fasting Lipid Panel: No results for input(s): "CHOL", "  HDL", "LDLCALC", "TRIG", "CHOLHDL", "LDLDIRECT" in the last 72 hours. Thyroid Function Tests: No results for input(s): "TSH", "T4TOTAL", "T3FREE", "THYROIDAB" in the last 72 hours.  Invalid input(s): "FREET3" Anemia Panel: No results for input(s): "VITAMINB12", "FOLATE", "FERRITIN", "TIBC", "IRON", "RETICCTPCT" in the last 72 hours.  CARDIAC CATHETERIZATION  Result Date: 01/17/2023   There is moderate to severe left ventricular systolic dysfunction.   LV end diastolic pressure is mildly elevated.   The left ventricular ejection fraction is 25-35% by visual estimate.   Hemodynamic findings consistent with mild pulmonary hypertension. 1.  Normal coronary anatomy 2.  Moderate to severely reduced left ventricular function with estimated LVEF 25-30% 3.  Near normal right-sided heart  pressures, with mildly elevated mean pulmonary arterial pressure consistent with mild pulmonary hypertension Recommendations 1.  Good medical management 2.  Possible referral to EP as outpatient to consider ICD for primary prevention     ECHO as above  TELEMETRY reviewed by me 01/17/23: atrial fibrillation rate 90-100s  EKG reviewed by me 01/17/23: atrial fibrillation RVR PVCs rate 121 bpm  DATA reviewed by me 01/17/23: last 24h vitals tele labs imaging I/O, hospitalist progress note  Principal Problem:   Acute exacerbation of CHF (congestive heart failure) (HCC) Active Problems:   Rheumatoid arthritis involving multiple sites (HCC)   Personal history of immunosuppressive therapy   Elevated troponin   Dyspnea on exertion   Weight gain   Wheelchair dependence   Hyponatremia   Acute on chronic combined systolic and diastolic CHF (congestive heart failure) (HCC)   Paroxysmal atrial fibrillation (HCC)    ASSESSMENT AND PLAN: Lindsay Arnold is a 82 y.o. female with a past medical history of severe rheumatoid arthritis, hypothyroidism who presented to the ED on 01/13/2023 for weakness, shortness of breath, abnormal lab work. Cardiology was consulted for further evaluation.   # Acute heart failure reduced EF (EF 25-30% this admission) # Non-ischemic cardiomyopathy Patient with worsening SOB, mild orthopnea, LE edema for the last few weeks. BNP significantly elevated this admission at 4300. Trops 35 > 35. EF reduced at 25-30%, patient denies any hx of heart failure. R+LHC this admission with normal coronaries, nearly normal right-sided pressures. Suspect new cardiomyopathy 2/2 new onset AF RVR.  -Continue IV lasix 40 mg daily. Will likely transition to po after tomorrow. -Continue metoprolol succinate 25 mg twice daily. Further additions to GDMT limited at this time by borderline BP.  -Continue to monitor renal function closely with diuresis.  -Strict I/Os and daily weights.    # Atrial  fibrillation RVR # New onset atrial fibrillation Patient found to be in AF RVR on admission, denies any prior history. Started on metoprolol for rate control.  -Discontinue heparin. Will plan to start eliquis 5 mg twice daily tomorrow for stroke risk reduction. -Continue metoprolol 25 mg twice daily for rate control.  -Consider rhythm control options prior to discharge pending results of LHC.   # Rheumatoid arthritis Patient with longstanding hx of rheumatoid arthritis s/p multiple therapies now wheelchair bound.  -Management per primary. -Consider [facility] outpatient for additional evaluation as some of her prior therapies for rheumatoid arthritis can cause heart failure.    This patient's case was discussed and created with Dr. Darrold Junker and he is in agreement.  Signed:  Gale Journey, PA-C  01/17/2023, 1:36 PM [facility] Cardiology

## 2023-01-17 NOTE — Plan of Care (Signed)
  Problem: Clinical Measurements: Goal: Cardiovascular complication will be avoided Outcome: Progressing   Problem: Coping: Goal: Level of anxiety will decrease Outcome: Progressing   Problem: Education: Goal: Understanding of CV disease, CV risk reduction, and recovery process will improve Outcome: Progressing   Problem: Education: Goal: Ability to demonstrate management of disease process will improve Outcome: Progressing Goal: Ability to verbalize understanding of medication therapies will improve Outcome: Progressing Goal: Individualized Educational Video(s) Outcome: Progressing   Problem: Activity: Goal: Capacity to carry out activities will improve Outcome: Progressing   Problem: Cardiac: Goal: Ability to achieve and maintain adequate cardiopulmonary perfusion will improve Outcome: Progressing

## 2023-01-17 NOTE — Evaluation (Signed)
Occupational Therapy Evaluation Patient Details Name: Lindsay Arnold MRN: [id number] DOB: 01/13/1941 Today's Date: 01/17/2023   History of Present Illness 82 y.o. female with a past medical history of severe rheumatoid arthritis, hypothyroidism who presented to the ED on 01/13/2023 for weakness, shortness of breath, abnormal lab work. Cardiology was consulted and LHC showed near normal coronary arteries. MD assessment: CHF exacerbation.   Clinical Impression   Pt was seen for OT evaluation this date. Prior to hospital admission, pt lives at home with her husband. Reports she was IND with ADLs and has not walked since May 2024. She has been performing SPT from W/C<>bed<>commode and has assist from husband to transfer to shower.  Pt presents to acute OT demonstrating impaired ADL performance and functional mobility 2/2 weakness, limited activity tolerance and balance deficits (See OT problem list for additional functional deficits). Pt with notable deformities to bil feet and hands from RA with limited BUE shoulder ROM and grip strength. Able to perform AROM to BLEs in all planes in bed. Declined any rolling or EOB activity d/t being tired after heart cath and not wanting to risk using her RUE d/t NWB status after heart cath. Limited assessment and unsure how close pt is to her baseline. Will reassess better during treatment session tomorrow.  Pt would benefit from skilled OT services to address noted impairments and functional limitations (see below for any additional details) in order to maximize safety and independence while minimizing falls risk and caregiver burden. Do anticipate the need for follow up OT services upon acute hospital DC may be [facility] or STR/SNF appropriate pending how she transfers tomorrow.        If plan is discharge home, recommend the following: A little help with bathing/dressing/bathroom;A lot of help with walking and/or transfers;Assistance with cooking/housework;Assist for  transportation;Help with stairs or ramp for entrance    Functional Status Assessment  Patient has had a recent decline in their functional status and demonstrates the ability to make significant improvements in function in a reasonable and predictable amount of time.  Equipment Recommendations  Hospital bed    Recommendations for Other Services       Precautions / Restrictions Precautions Precautions: Fall Restrictions Weight Bearing Restrictions: Yes RUE Weight Bearing: Non weight bearing (48 hrs) Other Position/Activity Restrictions: NWB 48 hrs to RUE d/t heart cath      Mobility Bed Mobility Overal bed mobility: Needs Assistance             General bed mobility comments: not tested, but pt BLE ROM assessed and able to move BLEs in all planes in bed    Transfers                          Balance Overall balance assessment: Needs assistance     Sitting balance - Comments: NT as pt declined getting OOB                                   ADL either performed or assessed with clinical judgement   ADL                                               Vision         Perception  Praxis         Pertinent Vitals/Pain Pain Assessment Pain Assessment: Faces Faces Pain Scale: No hurt Pain Intervention(s): Monitored during session     Extremity/Trunk Assessment Upper Extremity Assessment Upper Extremity Assessment: Generalized weakness;RUE deficits/detail;LUE deficits/detail RUE Deficits / Details: severe RA with deformities to hands/digits; limited shoulder ROM LUE Deficits / Details: severe RA with deformities to hands/digits; limited shoulder ROM   Lower Extremity Assessment Lower Extremity Assessment: Generalized weakness (severe RA with deformities to BLEs/ankles/toes)       Communication Communication Communication: No apparent difficulties   Cognition Arousal: Alert Behavior During Therapy: WFL  for tasks assessed/performed Overall Cognitive Status: Within Functional Limits for tasks assessed                                       General Comments  severe deformities from RA noted to hands and feet; unable to WB throughout RUE s/p heart cath for 48 hours    Exercises     Shoulder Instructions      Home Living Family/patient expects to be discharged to:: Private residence Living Arrangements: Spouse/significant other Available Help at Discharge: Family Type of Home: House             Bathroom Shower/Tub: Walk-in shower   Bathroom Toilet: Handicapped height     Home Equipment: Shower seat;Grab bars - toilet;Grab bars - tub/shower;Wheelchair - manual          Prior Functioning/Environment Prior Level of Function : Independent/Modified Independent             Mobility Comments: IND with SPT to her W/C<>bed<>commode; has husband assist to shower; has not walked since May 2024 ADLs Comments: IND with ADLs        OT Problem List: Decreased strength;Cardiopulmonary status limiting activity;Decreased activity tolerance;Impaired balance (sitting and/or standing)      OT Treatment/Interventions: Self-care/ADL training;Therapeutic activities;Therapeutic exercise;DME and/or AE instruction;Patient/family education;Balance training    OT Goals(Current goals can be found in the care plan section) Acute Rehab OT Goals Patient Stated Goal: improve strength OT Goal Formulation: With patient/family Time For Goal Achievement: 01/31/23 Potential to Achieve Goals: Good ADL Goals Pt Will Perform Lower Body Bathing: with contact guard assist;sitting/lateral leans;sit to/from stand Pt Will Perform Lower Body Dressing: with contact guard assist;sitting/lateral leans;sit to/from stand Pt Will Transfer to Toilet: with contact guard assist;bedside commode;stand pivot transfer Additional ADL Goal #1: Pt will perform bed mobility with SBA and good safety to  maximize IND and ability to return to PLOF.  OT Frequency: Min 1X/week    Co-evaluation              AM-PAC OT "6 Clicks" Daily Activity     Outcome Measure Help from another person eating meals?: None Help from another person taking care of personal grooming?: A Little Help from another person toileting, which includes using toliet, bedpan, or urinal?: A Lot Help from another person bathing (including washing, rinsing, drying)?: A Lot Help from another person to put on and taking off regular upper body clothing?: A Lot Help from another person to put on and taking off regular lower body clothing?: A Lot 6 Click Score: 15   End of Session    Activity Tolerance: Patient tolerated treatment well Patient left: in bed;with call bell/phone within reach;with bed alarm set;with family/visitor present  OT Visit Diagnosis: Other abnormalities of gait and mobility (R26.89);Muscle weakness (  generalized) (M62.81)                Time: 4782-9562 OT Time Calculation (min): 26 min Charges:  OT General Charges $OT Visit: 1 Visit OT Evaluation $OT Eval Low Complexity: 1 Low Jax Abdelrahman, OTR/L 01/17/23, 4:31 PM  Timberly Yott E Christian Treadway 01/17/2023, 4:28 PM

## 2023-01-18 ENCOUNTER — Inpatient Hospital Stay: Payer: Medicare Other

## 2023-01-18 DIAGNOSIS — R7989 Other specified abnormal findings of blood chemistry: Secondary | ICD-10-CM | POA: Diagnosis not present

## 2023-01-18 DIAGNOSIS — I480 Paroxysmal atrial fibrillation: Secondary | ICD-10-CM | POA: Diagnosis not present

## 2023-01-18 DIAGNOSIS — M069 Rheumatoid arthritis, unspecified: Secondary | ICD-10-CM | POA: Diagnosis not present

## 2023-01-18 DIAGNOSIS — I5023 Acute on chronic systolic (congestive) heart failure: Secondary | ICD-10-CM

## 2023-01-18 DIAGNOSIS — Z993 Dependence on wheelchair: Secondary | ICD-10-CM

## 2023-01-18 DIAGNOSIS — L089 Local infection of the skin and subcutaneous tissue, unspecified: Secondary | ICD-10-CM | POA: Insufficient documentation

## 2023-01-18 LAB — BASIC METABOLIC PANEL
Anion gap: 9 (ref 5–15)
BUN: 24 mg/dL — ABNORMAL HIGH (ref 8–23)
CO2: 24 mmol/L (ref 22–32)
Calcium: 7.8 mg/dL — ABNORMAL LOW (ref 8.9–10.3)
Chloride: 98 mmol/L (ref 98–111)
Creatinine, Ser: 0.59 mg/dL (ref 0.44–1.00)
GFR, Estimated: >60 mL/min (ref >60)
Glucose, Bld: 97 mg/dL (ref 70–99)
Potassium: 4.1 mmol/L (ref 3.5–5.1)
Sodium: 131 mmol/L — ABNORMAL LOW (ref 135–145)

## 2023-01-18 LAB — CBC
HCT: 37.7 % (ref 36.0–46.0)
Hemoglobin: 11.7 g/dL — ABNORMAL LOW (ref 12.0–15.0)
MCH: 24.6 pg — ABNORMAL LOW (ref 26.0–34.0)
MCHC: 31 g/dL (ref 30.0–36.0)
MCV: 79.4 fL — ABNORMAL LOW (ref 80.0–100.0)
Platelets: 264 10*3/uL (ref 150–400)
RBC: 4.75 MIL/uL (ref 3.87–5.11)
RDW: 18.9 % — ABNORMAL HIGH (ref 11.5–15.5)
WBC: 7.7 10*3/uL (ref 4.0–10.5)
nRBC: 0 % (ref 0.0–0.2)

## 2023-01-18 LAB — MAGNESIUM: Magnesium: 2.2 mg/dL (ref 1.7–2.4)

## 2023-01-18 MED ORDER — TOLNAFTATE 1 % EX CREA: TOPICAL_CREAM | Freq: Two times a day (BID) | CUTANEOUS | Status: DC

## 2023-01-18 MED ORDER — METOPROLOL SUCCINATE ER 50 MG PO TB24
50.0000 mg | ORAL_TABLET | Freq: Two times a day (BID) | ORAL | Status: DC
  Administered 2023-01-18 – 2023-01-23 (×8): 50 mg via ORAL
  Filled 2023-01-18 (×10): qty 1

## 2023-01-18 MED ORDER — CLOTRIMAZOLE 1 % EX CREA
TOPICAL_CREAM | Freq: Two times a day (BID) | CUTANEOUS | Status: DC
  Filled 2023-01-18: qty 15

## 2023-01-18 MED ORDER — AMIODARONE HCL 200 MG PO TABS
200.0000 mg | ORAL_TABLET | Freq: Two times a day (BID) | ORAL | Status: DC
  Administered 2023-01-18 – 2023-01-20 (×4): 200 mg via ORAL
  Filled 2023-01-18 (×4): qty 1

## 2023-01-18 MED ORDER — METOPROLOL SUCCINATE ER 25 MG PO TB24
25.0000 mg | ORAL_TABLET | Freq: Once | ORAL | Status: AC
  Administered 2023-01-18: 25 mg via ORAL
  Filled 2023-01-18: qty 1

## 2023-01-18 NOTE — Progress Notes (Signed)
Progress Note   Patient: Lindsay Arnold ZOX:[id number] DOB: [birth year] DOA: 01/13/2023     5 DOS: the patient was seen and examined on 01/18/2023   Brief hospital course: Ms. Lindsay Arnold is a 82 year old female with history of rheumatoid arthritis involving multiple sites, on long-term use of immunosuppression medication, chronic bedbound and wheelchair bound state due to severe RA, history of drug-induced leukopenia, B12 deficiency, hypothyroid, who presents to the emergency department from outpatient cardiology clinic for chief concerns of weakness and shortness of breath on exertion. Lab results showed BNP 3311, troponin 35.  Patient is seen by cardiology, started on IV Lasix for exacerbation congestive heart failure. Echocardiogram showed ejection fraction 25 to 30%, grade 2 diastolic dysfunction.  Moderate mitral valve regurgitation. Heart cath performed on 11/19, showed near normal coronary arteries.  11/20.  Cardiology making adjustments to medications with switching flecainide over to amiodarone and adding metoprolol.  Patient points out a spot on her toe that did have some bleeding last week.  Assessment and Plan: * Acute on chronic systolic CHF (congestive heart failure) (HCC) Patient received IV Lasix this morning.  Patient on Toprol-XL 50 mg twice a day.  Blood pressure limiting other medications currently.  Paroxysmal atrial fibrillation (HCC) Flecainide switched to amiodarone.  Cardiology titrating up Toprol.  Rheumatoid arthritis involving multiple sites [facility]) Patient gets subcutaneous injections outpatient every 28 days.    Elevated troponin Borderline elevated troponin.  Cardiac catheterization negative.  Elevated troponin likely demand ischemia from heart failure  Wheelchair dependence Patient able to pivot on her feet but unable to walk.  Hyponatremia Sodium 131.  Likely low with heart failure.  Toe infection Left foot.  Could possibly be fungal infection  between the toes.  There is an opening in the skin which could be a deeper seeded infection but less likely.  Will get podiatry consultation.  Start antifungal cream.  X-ray of the foot ordered.        Subjective: Patient feeling okay.  Usually only able to pivot.  Does not walk secondary to her rheumatoid arthritis.  Cardiology adjusting medications for heart rate.  Patient points out a spot on her toes that has been bothering her and has been bleeding.  Physical Exam: Vitals:   01/18/23 0413 01/18/23 0840 01/18/23 1244 01/18/23 1722  BP: 111/66 112/61 104/78 115/74  Pulse: (!) 106 97 (!) 109 (!) 110  Resp: 15 20 (!) 22 (!) 24  Temp:  98 F (36.7 C) 98.5 F (36.9 C) 98.5 F (36.9 C)  TempSrc:      SpO2: 100% 98% 98% 99%  Weight:      Height:       Physical Exam HENT:     Head: Normocephalic.     Mouth/Throat:     Pharynx: No oropharyngeal exudate.  Eyes:     General: Lids are normal.     Conjunctiva/sclera: Conjunctivae normal.  Cardiovascular:     Rate and Rhythm: Normal rate and regular rhythm.     Heart sounds: Normal heart sounds, S1 normal and S2 normal.  Pulmonary:     Breath sounds: No decreased breath sounds, wheezing, rhonchi or rales.  Abdominal:     Palpations: Abdomen is soft.     Tenderness: There is no abdominal tenderness.  Musculoskeletal:     Right lower leg: No swelling.     Left lower leg: No swelling.     Comments: Foot and toe deformities secondary to rheumatoid arthritis.  Skin:  General: Skin is warm.     Comments: See picture below  Neurological:     Mental Status: She is alert and oriented to person, place, and time.        Data Reviewed: Sodium 131, creatinine 0.57, hemoglobin 11.7, platelet count 264  Family Communication: Husband at bedside  Disposition: Status is: Inpatient Remains inpatient appropriate because: Cardiology still making adjustments in medications  Planned Discharge Destination: Home    Time spent: 28  minutes  Author: Alford Highland, MD 01/18/2023 5:24 PM  For on call review [email or web address].

## 2023-01-18 NOTE — Plan of Care (Signed)
  Problem: Activity: Goal: Ability to return to baseline activity level will improve Outcome: Progressing   Problem: Cardiovascular: Goal: Ability to achieve and maintain adequate cardiovascular perfusion will improve Outcome: Progressing Goal: Vascular access site(s) Level 0-1 will be maintained Outcome: Progressing   Problem: Education: Goal: Ability to demonstrate management of disease process will improve Outcome: Progressing Goal: Ability to verbalize understanding of medication therapies will improve Outcome: Progressing Goal: Individualized Educational Video(s) Outcome: Progressing   Problem: Activity: Goal: Capacity to carry out activities will improve Outcome: Progressing   Problem: Cardiac: Goal: Ability to achieve and maintain adequate cardiopulmonary perfusion will improve Outcome: Progressing

## 2023-01-18 NOTE — Care Management Important Message (Signed)
Important Message  Patient Details  Name: NYALA CUNDIFF MRN: [id number] Date of Birth: 12/30/1940   Important Message Given:  Yes - Medicare IM     Verita Schneiders Theda Payer 01/18/2023, 2:47 PM

## 2023-01-18 NOTE — Progress Notes (Signed)
Heart Failure Stewardship Pharmacy Note  PCP: Lynnea Ferrier, MD PCP-Cardiologist: None  HPI: Lindsay Arnold is a 82 y.o. female with severe rheumatoid arthritis being treated with Plaquenil and Cimzia, hypothyroidism who presented with weakness, shortness of breath on exertion and swelling that progressed over the past few weeks.  On admission, BNP was significantly elevated to 4311.9 and HS-troponin was 35. CXR on admission showed pulmonary edema with small bilateral pleural effusions. CTA PE showed the same with no evidence of PE. Echo on 01/14/23 showed LVEF of 25-30% with mdoerately reduced RV function with moderate TR, AR, and MR. RHC/RHC on 01/17/23 showed low CO/CI of 3.5/2, RA of 13, PW of 18, MPAP of 28, and nonobstructive coronary arteries.   Pertinent Lab Values: Creatinine  Date Value Ref Range Status  10/26/2011 0.50 (L) 0.60 - 1.30 mg/dL Final   Creatinine, Ser  Date Value Ref Range Status  01/18/2023 0.59 0.44 - 1.00 mg/dL Final   BUN  Date Value Ref Range Status  01/18/2023 24 (H) 8 - 23 mg/dL Final  16/11/9602 8 7 - 18 mg/dL Final   Potassium  Date Value Ref Range Status  01/18/2023 4.1 3.5 - 5.1 mmol/L Final  10/26/2011 3.7 3.5 - 5.1 mmol/L Final   Sodium  Date Value Ref Range Status  01/18/2023 131 (L) 135 - 145 mmol/L Final  10/26/2011 141 136 - 145 mmol/L Final   B Natriuretic Peptide  Date Value Ref Range Status  01/13/2023 4,311.9 (H) 0.0 - 100.0 pg/mL Final    Comment:    Performed at [facility], [address] [place], [place] [address]   Magnesium  Date Value Ref Range Status  01/18/2023 2.2 1.7 - 2.4 mg/dL Final    Comment:    Performed at [facility], [address] [place], [place] [address]    Vital Signs: Admission weight: Temp:  [98 F (36.7 C)-98.4 F (36.9 C)] 98 F (36.7 C) (11/19 2356) Pulse Rate:  [88-119] 106 (11/20 0413) Cardiac Rhythm: Atrial fibrillation (11/19 2142) Resp:  [14-24] 15 (11/20  0413) BP: (88-116)/(51-99) 111/66 (11/20 0413) SpO2:  [39 %-100 %] 100 % (11/20 0413) Weight:  [68.1 kg (150 lb 2.1 oz)] 68.1 kg (150 lb 2.1 oz) (11/19 0801)  Intake/Output Summary (Last 24 hours) at 01/18/2023 0748 Last data filed at 01/18/2023 0600 Gross per 24 hour  Intake 1360 ml  Output 600 ml  Net 760 ml   Current Heart Failure Medications:  Loop diuretic: furosemide 40 mg IV daily Beta-Blocker: metoprolol succinate 50 mg BID ACEI/ARB/ARNI: none MRA: none SGLT2i: none Other: none  Prior to admission Heart Failure Medications:  Loop diuretic: furosemide 20 mg daily Beta-Blocker: none ACEI/ARB/ARNI: none MRA: none SGLT2i: none Other: none  Assessment: 1. Acute systolic heart failure (LVEF 25-30%) with moderately reduced RV function, due to unknown etiology. NYHA class III symptoms.  -Symptoms: Patient reports shortness of breath and swelling are significantly improved. Reports mild orthopnea still. -Volume: Appears to still be closer to euvolemic. RA pressure yesterday was still up at 12. Unable to see JVP. Urine color is lighter yellow. Creatinine/BUN are stable. Urine output is decreasing, but patient reports dose is still effective. Will need at least another day of diuresis, may require a short course of oral furosemide for ~3 days if discharged.  -Hemodynamics: BP stable in 100-110s/80s. Pulse is elevated with AF.  -BB: Metoprolol succinate increased to 50 mg BID added for rate control. CI on cath yesterday was mildly  down, so will monitor closely. -ACEI/ARB/ARNI: BP is currently on the softer side, not able to add at this time. -MRA: K is improved after replacement, can consider adding tomorrow if BP is stable ~110 mmHg.  -SGLT2i: Recommend adding prior to discharge when purewick is removed. No history of UTIs. If issues moving to the commode are foreseen, would stay away from SGLT2i. -Cimzia and hydroxychloroquine can both cause/worsen heart failure. Recommend close  follow-up and reevaluation of medications for RA outpatient. -Flecainide increases the risk of ventricular arrhythmia and worsening heart failure in patients with LVEF <30%. Can potentially consider transition to amiodarone or Tikosyn if antiarrhythmic therapy is desired for AF.  Plan: 1) Medication changes recommended at this time: -Can consider oral furosemide tomorrow. Patient was on 20 mg daily at home. Can consider increasing to 40 mg daily in the short term until clinic follow-up. Will reevaluate based on volume status tomorrow.  2) Patient assistance: -Copay for Eliquis, Farxiga, and Jardiance are $47  3) Education: - Patient has been educated on current HF medications and potential additions to HF medication regimen - Patient verbalizes understanding that over the next few months, these medication doses may change and more medications may be added to optimize HF regimen - Patient has been educated on basic disease state pathophysiology and goals of therapy  Medication Assistance / Insurance Benefits Check: Does the patient have prescription insurance?    Type of insurance plan:  Does the patient qualify for medication assistance through manufacturers or grants? Pending   Outpatient Pharmacy: Prior to admission outpatient pharmacy: [facility]     Please do not hesitate to reach out with questions or concerns,  Enos Fling, PharmD, CPP, BCPS Heart Failure Pharmacist  Phone - [phone] 01/18/2023 7:48 AM

## 2023-01-18 NOTE — Evaluation (Signed)
Physical Therapy Evaluation Patient Details Name: Lindsay Arnold MRN: [id number] DOB: 11-Mar-1940 Today's Date: 01/18/2023  History of Present Illness  Pt is an 82 yo female who presents to the emergency department from outpatient cardiology clinic for chief concerns of weakness and shortness of breath on exertion. PMH includes rheumatoid arthritis involving multiple sites, on long-term use of immunosuppression medication, chronic bedbound and wheelchair bound state due to severe RA, history of drug-induced leukopenia, B12 deficiency, hypothyroid.   Clinical Impression  Pt alert and oriented, denies pain, and very receptive to therapy session today. Co-eval/treat with OT for pt/therapist safety and to maximize mobility. Pt lives with her spouse in a 1-level home, has been a manual WC user since her RA has worsened in may 2024, and was mod I with all squat pivot transfers and ADLs (with some assistance for showers). Pt has B/L deformities at hands and feet 2/2 severe RA. Pt found sitting on BSC upon entry, required minA to clear hips for OT to provide pericare and minAx2 for squat pivot transfer from [facility] to bed. Pt required cuing to abide by [facility] precautions. Pt able to roll L/R with CGA to assist with repositioning in bed. Pt very fatigued by end of session, but motivated to keep participating in therapy while at the hospital. Pt would continue to benefit from skilled acute PT to maximize functional independence and progress toward mobility goals.       If plan is discharge home, recommend the following: Two people to help with walking and/or transfers;Assistance with feeding;Help with stairs or ramp for entrance;Assist for transportation;Assistance with cooking/housework;Two people to help with bathing/dressing/bathroom   Can travel by private vehicle   No    Equipment Recommendations None recommended by PT  Recommendations for Other Services       Functional Status Assessment Patient has  had a recent decline in their functional status and demonstrates the ability to make significant improvements in function in a reasonable and predictable amount of time.     Precautions / Restrictions Precautions Precautions: Fall Restrictions Weight Bearing Restrictions: Yes RUE Weight Bearing: Non weight bearing Other Position/Activity Restrictions: NWB to RUE d/t heart cath until 11am today      Mobility  Bed Mobility Overal bed mobility: Needs Assistance Bed Mobility: Sit to Supine, Rolling Rolling: Contact guard assist     Sit to supine: Contact guard assist   General bed mobility comments: CGA for rolling to bil sides in bed and for return to supine    Transfers Overall transfer level: Needs assistance   Transfers: Bed to chair/wheelchair/BSC       Squat pivot transfers: Min assist, +2 physical assistance     General transfer comment: Min A x2 for SPT from [facility] to bed    Ambulation/Gait                  Stairs            Wheelchair Mobility     Tilt Bed    Modified Rankin (Stroke Patients Only)       Balance Overall balance assessment: Needs assistance, History of Falls Sitting-balance support: Feet supported, Bilateral upper extremity supported Sitting balance-Leahy Scale: Fair       Standing balance-Leahy Scale: Poor Standing balance comment: needs Min A for squat stand for short period of time; Min A x2 for SPT  Pertinent Vitals/Pain Pain Assessment Pain Assessment: No/denies pain    Home Living Family/patient expects to be discharged to:: Private residence Living Arrangements: Spouse/significant other Available Help at Discharge: Family Type of Home: House Home Access: Level entry       Home Layout: One level Home Equipment: Shower seat;Grab bars - toilet;Grab bars - tub/shower;Wheelchair - Forensic psychologist (2 wheels);Gil[place] Mor - single point      Prior Function Prior Level of  Function : Independent/Modified Independent             Mobility Comments: IND with SPT to her W/C<>bed<>commode; has husband assist to shower; has not walked since May 2024; pt reports recent fall out of her WC ADLs Comments: IND with ADLs     Extremity/Trunk Assessment   Upper Extremity Assessment Upper Extremity Assessment: Defer to OT evaluation RUE Deficits / Details: severe RA with deformities to hands/digits; limited shoulder ROM LUE Deficits / Details: severe RA with deformities to hands/digits; limited shoulder ROM    Lower Extremity Assessment Lower Extremity Assessment: Generalized weakness;RLE deficits/detail;LLE deficits/detail RLE Deficits / Details: severe RA with deformities to B/L hands and feet; hip and knee MMT 4/5 LLE Deficits / Details: severe RA with deformities to B/L hands and feet; hip and knee MMT 4/5    Cervical / Trunk Assessment Cervical / Trunk Assessment: Kyphotic  Communication   Communication Communication: No apparent difficulties Cueing Techniques: Verbal cues  Cognition Arousal: Alert Behavior During Therapy: WFL for tasks assessed/performed Overall Cognitive Status: Within Functional Limits for tasks assessed                                          General Comments General comments (skin integrity, edema, etc.): fatigues easily    Exercises     Assessment/Plan    PT Assessment Patient needs continued PT services  PT Problem List Decreased strength;Decreased mobility;Decreased activity tolerance;Decreased balance       PT Treatment Interventions DME instruction;Therapeutic exercise;Balance training;Functional mobility training;Therapeutic activities;Patient/family education    PT Goals (Current goals can be found in the Care Plan section)  Acute Rehab PT Goals Patient Stated Goal: to get stronger PT Goal Formulation: With patient Time For Goal Achievement: 02/01/23 Potential to Achieve Goals: Good     Frequency Min 1X/week     Co-evaluation PT/OT/SLP Co-Evaluation/Treatment: Yes Reason for Co-Treatment: To address functional/ADL transfers;For patient/therapist safety PT goals addressed during session: Mobility/safety with mobility OT goals addressed during session: ADL's and self-care       AM-PAC PT "6 Clicks" Mobility  Outcome Measure Help needed turning from your back to your side while in a flat bed without using bedrails?: A Little Help needed moving from lying on your back to sitting on the side of a flat bed without using bedrails?: A Little Help needed moving to and from a bed to a chair (including a wheelchair)?: A Little Help needed standing up from a chair using your arms (e.g., wheelchair or bedside chair)?: Total Help needed to walk in hospital room?: Total Help needed climbing 3-5 steps with a railing? : Total 6 Click Score: 12    End of Session Equipment Utilized During Treatment: Gait belt Activity Tolerance: Patient tolerated treatment well;Patient limited by fatigue Patient left: in bed;with call bell/phone within reach;with bed alarm set Nurse Communication: Mobility status PT Visit Diagnosis: Unsteadiness on feet (R26.81);Other abnormalities of gait and mobility (R26.89);History of  falling (Z91.81);Muscle weakness (generalized) (M62.81)    Time: 1610-9604 PT Time Calculation (min) (ACUTE ONLY): 20 min   Charges:   PT Evaluation $PT Eval Low Complexity: 1 Low   PT General Charges $$ ACUTE PT VISIT: 1 Visit           Shauna Hugh, SPT 01/18/2023, 11:55 AM

## 2023-01-18 NOTE — Assessment & Plan Note (Addendum)
Continue amiodarone.  Patient also on Toprol-XL 50 mg twice a day.  Patient on Eliquis twice daily.

## 2023-01-18 NOTE — Assessment & Plan Note (Addendum)
Sodium 128.  Tolvaptan dose given on 11/23.  Continue salt tablet once a day.  Discontinue once sodium above 130.  Regular diet.  Fluid restrict .

## 2023-01-18 NOTE — Progress Notes (Signed)
Education Assessment and Provision:  Detailed education and instructions provided on heart failure disease management including the following:  Signs and symptoms of Heart Failure When to call the physician Importance of daily weights Low sodium diet Fluid restriction Medication management Anticipated future follow-up appointments-pt will follow-up with [facility].  Patient education given on each of the above topics.  Patient acknowledges understanding via teach back method and acceptance of all instructions.  Education Materials:  "Living Better With Heart Failure" Booklet, HF zone tool, & Daily Weight Tracker Tool.  Patient has scale at home: Yes-but has difficulty with standing because of severe rheumatoid arthritis. Patient has pill box at home: Yes    Navigator will sign off at this time.  Roxy Horseman, RN, BSN [facility] Heart Failure Navigator Secure Chat Only

## 2023-01-18 NOTE — Progress Notes (Cosign Needed Addendum)
[facility] CLINIC CARDIOLOGY PROGRESS NOTE   Patient ID: Lindsay Arnold MRN: [id number] DOB/AGE: 81-Dec-1942 81 y.o.  Admit date: 01/13/2023 Referring Physician Dr. Londell Moh Primary Physician Lynnea Ferrier, MD  Primary Cardiologist Dr. Melton Alar  Reason for Consultation shortness of breath, acute heart failure  HPI: Lindsay Arnold is a 82 y.o. female with a past medical history of severe rheumatoid arthritis, hypothyroidism who presented to the ED on 01/13/2023 for weakness, shortness of breath, abnormal lab work. Cardiology was consulted for further evaluation.   Interval History:  -Patient reports she is feeling well this AM. Denies any SOB or BP. -HR elevated this AM, denies any palpitations.  -LE edema continues to improve. BP remains borderline.   Review of systems complete and found to be negative unless listed above    Vitals:   01/17/23 2142 01/17/23 2356 01/18/23 0413 01/18/23 0840  BP: 103/67 106/80 111/66 112/61  Pulse: (!) 101 94 (!) 106 97  Resp:   15 20  Temp:  98 F (36.7 C)  98 F (36.7 C)  TempSrc:      SpO2:  99% 100% 98%  Weight:      Height:         Intake/Output Summary (Last 24 hours) at 01/18/2023 0959 Last data filed at 01/18/2023 0600 Gross per 24 hour  Intake 1360 ml  Output 600 ml  Net 760 ml     PHYSICAL EXAM General: Chronically ill-appearing female, well nourished, in no acute distress laying nearly flat in hospital bed with family present at bedside. HEENT: Normocephalic and atraumatic. Neck: No JVD.  Lungs: Normal respiratory effort on room air. Clear bilaterally to auscultation. No wheezes, crackles, rhonchi.  Heart: Irregularly irregular. Normal S1 and S2 without gallops or murmurs. Radial & DP pulses 2+ bilaterally. Abdomen: Non-distended appearing.  Msk: Normal strength and tone for age. Extremities: Trace edema bilaterally.  Neuro: Alert and oriented X 3. Psych: Mood appropriate, affect congruent.    LABS: Basic Metabolic  Panel: Recent Labs    01/17/23 0504 01/17/23 0945 01/17/23 0952 01/18/23 0518  NA 131*   < > 133* 131*  K 3.9   < > 3.7 4.1  CL 95*  --   --  98  CO2 28  --   --  24  GLUCOSE 100*  --   --  97  BUN 28*  --   --  24*  CREATININE 0.77  --   --  0.59  CALCIUM 7.4*  --   --  7.8*  MG 2.2  --   --  2.2   < > = values in this interval not displayed.   Liver Function Tests: No results for input(s): "AST", "ALT", "ALKPHOS", "BILITOT", "PROT", "ALBUMIN" in the last 72 hours. No results for input(s): "LIPASE", "AMYLASE" in the last 72 hours. CBC: Recent Labs    01/17/23 0504 01/17/23 0945 01/17/23 0952 01/18/23 0518  WBC 7.0  --   --  7.7  HGB 12.2   < > 12.6 11.7*  HCT 39.0   < > 37.0 37.7  MCV 77.8*  --   --  79.4*  PLT 281  --   --  264   < > = values in this interval not displayed.   Cardiac Enzymes: No results for input(s): "CKTOTAL", "CKMB", "CKMBINDEX", "TROPONINIHS" in the last 72 hours.  BNP: No results for input(s): "BNP" in the last 72 hours. D-Dimer: No results for input(s): "DDIMER" in the last 72  hours. Hemoglobin A1C: No results for input(s): "HGBA1C" in the last 72 hours. Fasting Lipid Panel: No results for input(s): "CHOL", "HDL", "LDLCALC", "TRIG", "CHOLHDL", "LDLDIRECT" in the last 72 hours. Thyroid Function Tests: No results for input(s): "TSH", "T4TOTAL", "T3FREE", "THYROIDAB" in the last 72 hours.  Invalid input(s): "FREET3" Anemia Panel: No results for input(s): "VITAMINB12", "FOLATE", "FERRITIN", "TIBC", "IRON", "RETICCTPCT" in the last 72 hours.  CARDIAC CATHETERIZATION  Result Date: 01/17/2023   There is moderate to severe left ventricular systolic dysfunction.   LV end diastolic pressure is mildly elevated.   The left ventricular ejection fraction is 25-35% by visual estimate.   Hemodynamic findings consistent with mild pulmonary hypertension. 1.  Normal coronary anatomy 2.  Moderate to severely reduced left ventricular function with estimated  LVEF 25-30% 3.  Near normal right-sided heart pressures, with mildly elevated mean pulmonary arterial pressure consistent with mild pulmonary hypertension Recommendations 1.  Good medical management 2.  Possible referral to EP as outpatient to consider ICD for primary prevention     ECHO as above  TELEMETRY reviewed by me 01/18/23: atrial fibrillation rate 100-110s  EKG reviewed by me 01/18/23: atrial fibrillation RVR PVCs rate 121 bpm  DATA reviewed by me 01/18/23: last 24h vitals tele labs imaging I/O, hospitalist progress note  Principal Problem:   Acute exacerbation of CHF (congestive heart failure) (HCC) Active Problems:   Rheumatoid arthritis involving multiple sites (HCC)   Personal history of immunosuppressive therapy   Elevated troponin   Dyspnea on exertion   Weight gain   Wheelchair dependence   Hyponatremia   Acute on chronic combined systolic and diastolic CHF (congestive heart failure) (HCC)   Paroxysmal atrial fibrillation (HCC)    ASSESSMENT AND PLAN: Lindsay Arnold is a 82 y.o. female with a past medical history of severe rheumatoid arthritis, hypothyroidism who presented to the ED on 01/13/2023 for weakness, shortness of breath, abnormal lab work. Cardiology was consulted for further evaluation.   # Acute heart failure reduced EF (EF 25-30% this admission) # Non-ischemic cardiomyopathy Patient with worsening SOB, mild orthopnea, LE edema for the last few weeks. BNP significantly elevated this admission at 4300. Trops 35 > 35. EF reduced at 25-30%, patient denies any hx of heart failure. R+LHC this admission with normal coronaries, nearly normal right-sided pressures. Suspect new cardiomyopathy 2/2 new onset AF RVR.  -Continue IV lasix 40 mg daily. Will likely transition to po tomorrow. -Increase metoprolol succinate to 50 mg twice daily. Further additions to GDMT limited at this time by borderline BP. Consider addition of spironolactone at follow up appointment  pending BP and renal function.  -Continue to monitor renal function closely with diuresis.  -Strict I/Os and daily weights.    # Atrial fibrillation RVR # New onset atrial fibrillation Patient found to be in AF RVR on admission, denies any prior history. Started on metoprolol for rate control.  -Start eliquis 5 mg twice daily for stroke risk reduction. -Increase metoprolol to 50 mg bid for rate control.  -Switch to amiodarone for antiarrhythmic given risk of worsening heart failure with flecainide. Will consider cardioversion outpatient after follow up appointment if she remains in atrial fibrillation.   # Rheumatoid arthritis Patient with longstanding hx of rheumatoid arthritis s/p multiple therapies now wheelchair bound.  -Management per primary. -Consider CMRI outpatient for additional evaluation as some of her prior therapies for rheumatoid arthritis can cause heart failure.    This patient's case was discussed and created with Dr. Darrold Junker and he is  in agreement.  Signed:  Gale Journey, PA-C  01/18/2023, 9:59 AM [facility]

## 2023-01-18 NOTE — Plan of Care (Signed)
  Problem: Education: Goal: Knowledge of General Education information will improve Description: Including pain rating scale, medication(s)/side effects and non-pharmacologic comfort measures Outcome: Progressing   Problem: Health Behavior/Discharge Planning: Goal: Ability to manage health-related needs will improve Outcome: Progressing   Problem: Clinical Measurements: Goal: Ability to maintain clinical measurements within normal limits will improve Outcome: Progressing Goal: Will remain free from infection Outcome: Progressing Goal: Diagnostic test results will improve Outcome: Progressing Goal: Respiratory complications will improve Outcome: Progressing Goal: Cardiovascular complication will be avoided Outcome: Progressing   Problem: Activity: Goal: Risk for activity intolerance will decrease Outcome: Progressing   Problem: Nutrition: Goal: Adequate nutrition will be maintained Outcome: Progressing   Problem: Coping: Goal: Level of anxiety will decrease Outcome: Progressing   Problem: Elimination: Goal: Will not experience complications related to bowel motility Outcome: Progressing Goal: Will not experience complications related to urinary retention Outcome: Progressing   Problem: Pain Management: Goal: General experience of comfort will improve Outcome: Progressing   Problem: Safety: Goal: Ability to remain free from injury will improve Outcome: Progressing   Problem: Skin Integrity: Goal: Risk for impaired skin integrity will decrease Outcome: Progressing   Problem: Education: Goal: Understanding of CV disease, CV risk reduction, and recovery process will improve Outcome: Progressing Goal: Individualized Educational Video(s) Outcome: Progressing   Problem: Activity: Goal: Ability to return to baseline activity level will improve Outcome: Progressing   Problem: Cardiovascular: Goal: Ability to achieve and maintain adequate cardiovascular perfusion  will improve Outcome: Progressing Goal: Vascular access site(s) Level 0-1 will be maintained Outcome: Progressing   Problem: Health Behavior/Discharge Planning: Goal: Ability to safely manage health-related needs after discharge will improve Outcome: Progressing   Problem: Education: Goal: Ability to demonstrate management of disease process will improve Outcome: Progressing Goal: Ability to verbalize understanding of medication therapies will improve Outcome: Progressing Goal: Individualized Educational Video(s) Outcome: Progressing   Problem: Activity: Goal: Capacity to carry out activities will improve Outcome: Progressing   Problem: Cardiac: Goal: Ability to achieve and maintain adequate cardiopulmonary perfusion will improve Outcome: Progressing

## 2023-01-18 NOTE — Assessment & Plan Note (Signed)
Left foot.  Could possibly be fungal infection between the toes.  There is an opening in the skin which could be a deeper seeded infection but less likely.  Will get podiatry consultation.  Start antifungal cream.  X-ray of the foot ordered.

## 2023-01-18 NOTE — Assessment & Plan Note (Addendum)
Restart Lasix tomorrow.  Patient on Toprol-XL 50 mg twice a day.  Blood pressure limiting other medications currently.

## 2023-01-18 NOTE — Progress Notes (Signed)
Occupational Therapy Treatment Patient Details Name: Lindsay Arnold MRN: [id number] DOB: 22-Apr-1940 Today's Date: 01/18/2023   History of present illness 82 y.o. female with a past medical history of severe rheumatoid arthritis, hypothyroidism who presented to the ED on 01/13/2023 for weakness, shortness of breath, abnormal lab work. Cardiology was consulted and LHC showed near normal coronary arteries. MD assessment: CHF exacerbation.   OT comments  PT/OT session performed this morning to maximize pt/therapist safety with transfers. Pt is seated on BSC on arrival. Pleasant and agreeable to OT session. She denies pain, endorses fatigue. Pt performed STS via more of a squat stand from [facility] with Min A for hygiene to be performed. Max A for posterior hygiene. Pt standing time very limited ~10 seconds. Min A x2 required for SPT from [facility] to bed. CGA for return to supine and rolling to bil sides in bed for panties to be pulled over hips. Mod A x2 to scoot to [facility]. Pt reports being tired/fatigued after session. Pt returned to bed with all needs in place and will cont to require skilled acute OT services to maximize her safety and IND to return to PLOF. Would benefit from STR to maximize strength, safety and IND with transfers to return to PLOF.       If plan is discharge home, recommend the following:  A little help with bathing/dressing/bathroom;Assistance with cooking/housework;Assist for transportation;Help with stairs or ramp for entrance;A lot of help with walking and/or transfers   Equipment Recommendations  Hospital bed    Recommendations for Other Services      Precautions / Restrictions Precautions Precautions: Fall Restrictions Weight Bearing Restrictions: Yes RUE Weight Bearing: Non weight bearing Other Position/Activity Restrictions: NWB to RUE d/t heart cath until 11am today       Mobility Bed Mobility Overal bed mobility: Needs Assistance Bed Mobility: Sit to Supine,  Rolling Rolling: Contact guard assist     Sit to supine: Contact guard assist   General bed mobility comments: CGA for rollin gto bil sides in bed and for return to supine; Mod A x2 to scoot to [facility]    Transfers Overall transfer level: Needs assistance   Transfers: Bed to chair/wheelchair/BSC     Squat pivot transfers: Min assist, +2 physical assistance       General transfer comment: Min A x2 for SPT from [facility] to bed     Balance Overall balance assessment: Needs assistance Sitting-balance support: Feet supported, Bilateral upper extremity supported Sitting balance-Leahy Scale: Fair       Standing balance-Leahy Scale: Poor Standing balance comment: needs Min A for squat stand for short period of time; Min A x2 for SPT                           ADL either performed or assessed with clinical judgement   ADL Overall ADL's : Needs assistance/impaired                         Toilet Transfer: Minimal assistance;+2 for physical assistance;BSC/3in1   Toileting- Clothing Manipulation and Hygiene: Maximal assistance;Sit to/from stand Toileting - Clothing Manipulation Details (indicate cue type and reason): squat stand with Min A from PT for short period of time ~10 seconds or so            Extremity/Trunk Assessment Upper Extremity Assessment RUE Deficits / Details: severe RA with deformities to hands/digits; limited shoulder ROM LUE Deficits / Details:  severe RA with deformities to hands/digits; limited shoulder ROM            Vision       Perception     Praxis      Cognition Arousal: Alert Behavior During Therapy: WFL for tasks assessed/performed Overall Cognitive Status: Within Functional Limits for tasks assessed                                          Exercises      Shoulder Instructions       General Comments fatigues easily    Pertinent Vitals/ Pain       Pain Assessment Pain Assessment: No/denies  pain Pain Intervention(s): Monitored during session, Limited activity within patient's tolerance  Home Living                                          Prior Functioning/Environment              Frequency  Min 1X/week        Progress Toward Goals  OT Goals(current goals can now be found in the care plan section)  Progress towards OT goals: Progressing toward goals  Acute Rehab OT Goals Patient Stated Goal: improve strength OT Goal Formulation: With patient/family Time For Goal Achievement: 01/31/23 Potential to Achieve Goals: Good  Plan      Co-evaluation    PT/OT/SLP Co-Evaluation/Treatment: Yes Reason for Co-Treatment: To address functional/ADL transfers;For patient/therapist safety PT goals addressed during session: Mobility/safety with mobility OT goals addressed during session: ADL's and self-care      AM-PAC OT "6 Clicks" Daily Activity     Outcome Measure   Help from another person eating meals?: None Help from another person taking care of personal grooming?: A Little Help from another person toileting, which includes using toliet, bedpan, or urinal?: A Lot Help from another person bathing (including washing, rinsing, drying)?: A Lot Help from another person to put on and taking off regular upper body clothing?: A Lot Help from another person to put on and taking off regular lower body clothing?: A Lot 6 Click Score: 15    End of Session Equipment Utilized During Treatment: Gait belt  OT Visit Diagnosis: Other abnormalities of gait and mobility (R26.89);Muscle weakness (generalized) (M62.81)   Activity Tolerance Patient tolerated treatment well   Patient Left in bed;with call bell/phone within reach;with bed alarm set;with family/visitor present   Nurse Communication          Time: 2956-2130 OT Time Calculation (min): 23 min  Charges: OT General Charges $OT Visit: 1 Visit OT Treatments $Self Care/Home Management : 8-22  mins  Roc Streett, OTR/L  01/18/23, 10:48 AM   Constance Goltz 01/18/2023, 10:44 AM

## 2023-01-19 DIAGNOSIS — M069 Rheumatoid arthritis, unspecified: Secondary | ICD-10-CM | POA: Diagnosis not present

## 2023-01-19 DIAGNOSIS — L97522 Non-pressure chronic ulcer of other part of left foot with fat layer exposed: Secondary | ICD-10-CM

## 2023-01-19 DIAGNOSIS — R7989 Other specified abnormal findings of blood chemistry: Secondary | ICD-10-CM | POA: Diagnosis not present

## 2023-01-19 DIAGNOSIS — I5023 Acute on chronic systolic (congestive) heart failure: Secondary | ICD-10-CM | POA: Diagnosis not present

## 2023-01-19 DIAGNOSIS — I480 Paroxysmal atrial fibrillation: Secondary | ICD-10-CM | POA: Diagnosis not present

## 2023-01-19 LAB — BASIC METABOLIC PANEL
Anion gap: 13 (ref 5–15)
BUN: 26 mg/dL — ABNORMAL HIGH (ref 8–23)
CO2: 24 mmol/L (ref 22–32)
Calcium: 8.2 mg/dL — ABNORMAL LOW (ref 8.9–10.3)
Chloride: 91 mmol/L — ABNORMAL LOW (ref 98–111)
Creatinine, Ser: 0.72 mg/dL (ref 0.44–1.00)
GFR, Estimated: >60 mL/min (ref >60)
Glucose, Bld: 105 mg/dL — ABNORMAL HIGH (ref 70–99)
Potassium: 4.5 mmol/L (ref 3.5–5.1)
Sodium: 128 mmol/L — ABNORMAL LOW (ref 135–145)

## 2023-01-19 LAB — SEDIMENTATION RATE: Sed Rate: 4 mm/h (ref 0–30)

## 2023-01-19 MED ORDER — ACETAMINOPHEN 500 MG PO TABS
1000.0000 mg | ORAL_TABLET | Freq: Four times a day (QID) | ORAL | Status: AC | PRN
  Administered 2023-01-19 – 2023-01-20 (×3): 1000 mg via ORAL
  Filled 2023-01-19 (×3): qty 2

## 2023-01-19 MED ORDER — ACETAMINOPHEN 650 MG RE SUPP: 650.0000 mg | Freq: Four times a day (QID) | RECTAL | Status: AC | PRN

## 2023-01-19 MED ORDER — FUROSEMIDE 20 MG PO TABS
20.0000 mg | ORAL_TABLET | Freq: Every day | ORAL | Status: DC
  Administered 2023-01-20: 20 mg via ORAL
  Filled 2023-01-19: qty 1

## 2023-01-19 NOTE — TOC Progression Note (Signed)
Transition of Care [facility]) - Progression Note    Patient Details  Name: Lindsay Arnold MRN: [id number] Date of Birth: 11/15/1940  Transition of Care [facility]) CM/SW Contact  Truddie Hidden, RN Phone Number: 01/19/2023, 3:12 PM  Clinical Narrative:    Spoke with patient and her family at the bedside regarding therapy's recommendation for SNF. She is agreeable to SNF.  RNCM explained process for obtaining a SNF vs going home with HH. 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 Patient was advised once medical readiness is reached she will discharge to chosen facility via EMS. Family inquired and educated about HH vs PCS.   FL2 completed.  Bed search initiated.          Expected Discharge Plan and Services                                               Social Determinants of Health (SDOH) Interventions SDOH Screenings   Food Insecurity: No Food Insecurity (01/13/2023)  Housing: Low Risk  (01/18/2023)  Transportation Needs: No Transportation Needs (01/18/2023)  Utilities: Not At Risk (01/13/2023)  Financial Resource Strain: Low Risk  (01/18/2023)  Tobacco Use: Low Risk  (01/13/2023)    Readmission Risk Interventions     No data to display

## 2023-01-19 NOTE — Progress Notes (Signed)
Heart Failure Stewardship Pharmacy Note  PCP: Lynnea Ferrier, MD PCP-Cardiologist: None  HPI: Lindsay Arnold is a 82 y.o. female with severe rheumatoid arthritis being treated with Plaquenil and Cimzia, hypothyroidism who presented with weakness, shortness of breath on exertion and swelling that progressed over the past few weeks.  On admission, BNP was significantly elevated to 4311.9 and HS-troponin was 35. CXR on admission showed pulmonary edema with small bilateral pleural effusions. CTA PE showed the same with no evidence of PE. Echo on 01/14/23 showed LVEF of 25-30% with mdoerately reduced RV function with moderate TR, AR, and MR. RHC/RHC on 01/17/23 showed low CO/CI of 3.5/2, RA of 13, PW of 18, MPAP of 28, and nonobstructive coronary arteries.   Pertinent Lab Values: Creatinine  Date Value Ref Range Status  10/26/2011 0.50 (L) 0.60 - 1.30 mg/dL Final   Creatinine, Ser  Date Value Ref Range Status  01/19/2023 0.72 0.44 - 1.00 mg/dL Final   BUN  Date Value Ref Range Status  01/19/2023 26 (H) 8 - 23 mg/dL Final  16/11/9602 8 7 - 18 mg/dL Final   Potassium  Date Value Ref Range Status  01/19/2023 4.5 3.5 - 5.1 mmol/L Final  10/26/2011 3.7 3.5 - 5.1 mmol/L Final   Sodium  Date Value Ref Range Status  01/19/2023 128 (L) 135 - 145 mmol/L Final  10/26/2011 141 136 - 145 mmol/L Final   B Natriuretic Peptide  Date Value Ref Range Status  01/13/2023 4,311.9 (H) 0.0 - 100.0 pg/mL Final    Comment:    Performed at [facility], [address] [place], [place] [address]   Magnesium  Date Value Ref Range Status  01/18/2023 2.2 1.7 - 2.4 mg/dL Final    Comment:    Performed at [facility], [address] [place], [place] [address]    Vital Signs:  Temp:  [97.5 F (36.4 C)-98.5 F (36.9 C)] 97.6 F (36.4 C) (11/21 0414) Pulse Rate:  [53-110] 53 (11/21 0414) Cardiac Rhythm: Atrial fibrillation (11/20 2012) Resp:  [18-24] 18 (11/21 0414) BP:  (103-123)/(61-78) 123/75 (11/21 0414) SpO2:  [97 %-99 %] 98 % (11/21 0414)  Intake/Output Summary (Last 24 hours) at 01/19/2023 0745 Last data filed at 01/18/2023 1600 Gross per 24 hour  Intake --  Output 1375 ml  Net -1375 ml   Current Heart Failure Medications:  Loop diuretic: furosemide 40 mg IV daily Beta-Blocker: metoprolol succinate 50 mg BID ACEI/ARB/ARNI: none MRA: none SGLT2i: none Other: none  Prior to admission Heart Failure Medications:  Loop diuretic: furosemide 20 mg daily Beta-Blocker: none ACEI/ARB/ARNI: none MRA: none SGLT2i: none Other: none  Assessment: 1. Acute systolic heart failure (LVEF 25-30%) with moderately reduced RV function, due to unknown etiology. NYHA class III symptoms.  -Symptoms: Patient reports shortness of breath and swelling are significantly improved. Denies orthopnea. -Volume: Appears to be euvolemic. Urine color is dark now. Creatinine/BUN are both starting to trend up. Urine output was decent yesterday, but appears to be declining this morning. Can likely hold furosemide today and consider starting home furosemide 20 mg daily tomorrow. -Hemodynamics: BP stable in 100-120s/80s. Pulse is elevated with AF with a lower reading this AM. May not be picking up due to PVCs.  -BB: Metoprolol succinate 50 mg BID. Rate is better controlled today. Will likely need dose reduction if converts to NSR on amiodarone. -ACEI/ARB/ARNI: BP is currently on the softer side, but appears to be improving. Can consider adding if BP increased. -MRA:  K is improved after replacement. Can consider adding spironolactone 12.5 mg daily tomorrow if SBP stays ~110.  -SGLT2i: May not be an ideal candidate given recent decline in ambulation as this may reduce mobility to the restroom. -Cimzia and hydroxychloroquine can both cause/worsen heart failure. Recommend close follow-up and reevaluation of medications for RA outpatient. -Frequent PVCs noted, will monitor on metoprolol  and amiodarone.  Plan: 1) Medication changes recommended at this time: -None. Can consider oral furosemide tomorrow. Patient was on 20 mg daily at home.   2) Patient assistance: -Copay for Eliquis, Farxiga, and Jardiance are $47  3) Education: - Patient has been educated on current HF medications and potential additions to HF medication regimen - Patient verbalizes understanding that over the next few months, these medication doses may change and more medications may be added to optimize HF regimen - Patient has been educated on basic disease state pathophysiology and goals of therapy  Medication Assistance / Insurance Benefits Check: Does the patient have prescription insurance?    Type of insurance plan:  Does the patient qualify for medication assistance through manufacturers or grants? Pending   Outpatient Pharmacy: Prior to admission outpatient pharmacy: [facility]     Please do not hesitate to reach out with questions or concerns,  Enos Fling, PharmD, CPP, BCPS Heart Failure Pharmacist  Phone - [phone] 01/19/2023 7:45 AM

## 2023-01-19 NOTE — Progress Notes (Signed)
[facility] CLINIC CARDIOLOGY PROGRESS NOTE   Patient ID: Lindsay Arnold MRN: [id number] DOB/AGE: 03/12/1940 82 y.o.  Admit date: 01/13/2023 Referring Physician Dr. Londell Moh Primary Physician Lynnea Ferrier, MD  Primary Cardiologist Dr. Melton Alar  Reason for Consultation shortness of breath, acute heart failure  HPI: Lindsay Arnold is a 82 y.o. female with a past medical history of severe rheumatoid arthritis, hypothyroidism who presented to the ED on 01/13/2023 for weakness, shortness of breath, abnormal lab work. Cardiology was consulted for further evaluation.   Interval History:  -Patient seen and examined this AM. States she is feeling well overall.  -HR improved today. PVCs noted on tele. BP stable.  -LE edema significantly improved, UOP decreasing.   Review of systems complete and found to be negative unless listed above    Vitals:   01/18/23 2339 01/19/23 0414 01/19/23 0953 01/19/23 1012  BP: 103/67 123/75  (!) 118/58  Pulse:  (!) 53 (!) 102 (!) 54  Resp: 18 18  (!) 24  Temp: (!) 97.5 F (36.4 C) 97.6 F (36.4 C)  98.1 F (36.7 C)  TempSrc:    Oral  SpO2: 97% 98%  (!) 78%  Weight:      Height:         Intake/Output Summary (Last 24 hours) at 01/19/2023 1408 Last data filed at 01/18/2023 1600 Gross per 24 hour  Intake --  Output 300 ml  Net -300 ml     PHYSICAL EXAM General: Chronically ill-appearing female, well nourished, in no acute distress laying nearly flat in hospital bed with family present at bedside. HEENT: Normocephalic and atraumatic. Neck: No JVD.  Lungs: Normal respiratory effort on room air. Clear bilaterally to auscultation. No wheezes, crackles, rhonchi.  Heart: Irregularly irregular. Normal S1 and S2 without gallops or murmurs. Radial & DP pulses 2+ bilaterally. Abdomen: Non-distended appearing.  Msk: Normal strength and tone for age. Extremities: No edema bilaterally.  Neuro: Alert and oriented X 3. Psych: Mood appropriate, affect  congruent.    LABS: Basic Metabolic Panel: Recent Labs    01/17/23 0504 01/17/23 0945 01/18/23 0518 01/19/23 0418  NA 131*   < > 131* 128*  K 3.9   < > 4.1 4.5  CL 95*  --  98 91*  CO2 28  --  24 24  GLUCOSE 100*  --  97 105*  BUN 28*  --  24* 26*  CREATININE 0.77  --  0.59 0.72  CALCIUM 7.4*  --  7.8* 8.2*  MG 2.2  --  2.2  --    < > = values in this interval not displayed.   Liver Function Tests: No results for input(s): "AST", "ALT", "ALKPHOS", "BILITOT", "PROT", "ALBUMIN" in the last 72 hours. No results for input(s): "LIPASE", "AMYLASE" in the last 72 hours. CBC: Recent Labs    01/17/23 0504 01/17/23 0945 01/17/23 0952 01/18/23 0518  WBC 7.0  --   --  7.7  HGB 12.2   < > 12.6 11.7*  HCT 39.0   < > 37.0 37.7  MCV 77.8*  --   --  79.4*  PLT 281  --   --  264   < > = values in this interval not displayed.   Cardiac Enzymes: No results for input(s): "CKTOTAL", "CKMB", "CKMBINDEX", "TROPONINIHS" in the last 72 hours.  BNP: No results for input(s): "BNP" in the last 72 hours. D-Dimer: No results for input(s): "DDIMER" in the last 72 hours. Hemoglobin A1C: No results  for input(s): "HGBA1C" in the last 72 hours. Fasting Lipid Panel: No results for input(s): "CHOL", "HDL", "LDLCALC", "TRIG", "CHOLHDL", "LDLDIRECT" in the last 72 hours. Thyroid Function Tests: No results for input(s): "TSH", "T4TOTAL", "T3FREE", "THYROIDAB" in the last 72 hours.  Invalid input(s): "FREET3" Anemia Panel: No results for input(s): "VITAMINB12", "FOLATE", "FERRITIN", "TIBC", "IRON", "RETICCTPCT" in the last 72 hours.  DG Foot 2 Views Left  Result Date: 01/18/2023 CLINICAL DATA:  Left foot pain for 1 week. EXAM: LEFT FOOT - 2 VIEW COMPARISON:  None Available. FINDINGS: Extensive degenerative changes are seen involving the midfoot and hindfoot as well as the talotibial joint. Status post surgical posterior fusion of first metatarsophalangeal joint. Significant flexion deformity is  seen involving the other 4 toes. Diffuse osteopenia is noted. Deformity of the distal portions of the second, third and fourth metatarsals is noted suggesting possible psoriasis. IMPRESSION: Chronic findings as noted above. No definite acute abnormality is seen, although fracture osteomyelitis could be obscured due to the extensive deformity of this foot. Electronically Signed   By: Lupita Raider M.D.   On: 01/18/2023 19:12     ECHO as above  TELEMETRY reviewed by me 01/19/23: atrial fibrillation PVCs rate 90s  EKG reviewed by me 01/19/23: atrial fibrillation RVR PVCs rate 121 bpm  DATA reviewed by me 01/19/23: last 24h vitals tele labs imaging I/O, hospitalist progress note  Principal Problem:   Acute on chronic systolic CHF (congestive heart failure) (HCC) Active Problems:   Rheumatoid arthritis involving multiple sites [facility])   Personal history of immunosuppressive therapy   Elevated troponin   Wheelchair dependence   Hyponatremia   Paroxysmal atrial fibrillation (HCC)   Toe ulcer, left, with fat layer exposed (HCC)    ASSESSMENT AND PLAN: Lindsay Arnold is a 82 y.o. female with a past medical history of severe rheumatoid arthritis, hypothyroidism who presented to the ED on 01/13/2023 for weakness, shortness of breath, abnormal lab work. Cardiology was consulted for further evaluation.   # Acute heart failure reduced EF (EF 25-30% this admission) # Non-ischemic cardiomyopathy Patient with worsening SOB, mild orthopnea, LE edema for the last few weeks. BNP significantly elevated this admission at 4300. Trops 35 > 35. EF reduced at 25-30%, patient denies any hx of heart failure. R+LHC this admission with normal coronaries, nearly normal right-sided pressures. Suspect new cardiomyopathy 2/2 new onset AF RVR.  -Plan to transition to po lasix 20 mg daily tomorrow.  -Continue metoprolol succinate 50 mg twice daily. Further additions to GDMT limited at this time by borderline BP. Consider  addition of spironolactone at follow up appointment pending BP and renal function.  -Continue to monitor renal function closely with diuresis.  -Strict I/Os and daily weights.    # Atrial fibrillation RVR # New onset atrial fibrillation Patient found to be in AF RVR on admission, denies any prior history. Started on metoprolol for rate control.  -Continue eliquis 5 mg twice daily for stroke risk reduction. -Continue metoprolol 50 mg bid for rate control.  -Continue amiodarone 200 mg twice daily. Will consider cardioversion outpatient after follow up appointment if she remains in atrial fibrillation.   # Rheumatoid arthritis Patient with longstanding hx of rheumatoid arthritis s/p multiple therapies now wheelchair bound.  -Management per primary. -Consider [facility] outpatient for additional evaluation as some of her prior therapies for rheumatoid arthritis can cause heart failure.    This patient's case was discussed and created with Dr. Darrold Junker and he is in agreement.  Signed:  Gale Journey, PA-C  01/19/2023, 2:08 PM [facility] Cardiology

## 2023-01-19 NOTE — [place] FL2 (Signed)
[place] MEDICAID FL2 LEVEL OF CARE FORM     IDENTIFICATION  Patient Name: Lindsay Arnold Birthdate: 1940-10-31 Sex: female Admission Date (Current Location): 01/13/2023  [facility] and [place] Number:  Chiropodist and Address:  [facility], [address], [place], [place] [address]      Provider Number: [id number]  Attending Physician Name and Address:  Alford Highland, MD  Relative Name and Phone Number:  Countess,Geoffrey (Spouse)  [phone] (Mobile)    Current Level of Care: Hospital Recommended Level of Care: Skilled Nursing Facility Prior Approval Number:    Date Approved/Denied:   PASRR Number: [id number]  Discharge Plan: SNF    Current Diagnoses: Patient Active Problem List   Diagnosis Date Noted   Toe ulcer, left, with fat layer exposed (HCC) 01/19/2023   Paroxysmal atrial fibrillation (HCC) 01/16/2023   Acute on chronic systolic CHF (congestive heart failure) (HCC) 01/15/2023   Hyponatremia 01/14/2023   Rheumatoid arthritis involving multiple sites (HCC) 01/13/2023   Personal history of immunosuppressive therapy 01/13/2023   Elevated troponin 01/13/2023   Wheelchair dependence 01/13/2023    Orientation RESPIRATION BLADDER Height & Weight     Self, Time, Situation, Place  Normal Continent Weight: 68.1 kg Height:  5\' 5"  (165.1 cm)  BEHAVIORAL SYMPTOMS/MOOD NEUROLOGICAL BOWEL NUTRITION STATUS  Other (Comment) (n/a)  (n/a) Continent Diet  AMBULATORY STATUS COMMUNICATION OF NEEDS Skin   Limited Assist Verbally Other (Comment) (Non healing wound L anterior foot at fold above contracted toes, fourth and fifth toe ulcers)                       Personal Care Assistance Level of Assistance  Bathing, Feeding, Dressing Bathing Assistance: Limited assistance Feeding assistance: Limited assistance Dressing Assistance: Limited assistance     Functional Limitations Info  Sight, Hearing Sight Info: Adequate Hearing  Info: Adequate      SPECIAL CARE FACTORS FREQUENCY  PT (By licensed PT), OT (By licensed OT)     PT Frequency: Min 2 x weekly OT Frequency: Min 2 x weekly            Contractures Contractures Info: Present (Contractures to right and left hands, L foot toes contracted)    Additional Factors Info  Code Status, Allergies Code Status Info: DNR Allergies Info: Celebrex (Celecoxib), Penicillins, Abatacept           Current Medications (01/19/2023):  This is the current hospital active medication list Current Facility-Administered Medications  Medication Dose Route Frequency Provider Last Rate Last Admin   acetaminophen (TYLENOL) tablet 650 mg  650 mg Oral Q6H PRN Cox, Amy N, DO   650 mg at 01/19/23 1231   Or   acetaminophen (TYLENOL) suppository 650 mg  650 mg Rectal Q6H PRN Cox, Amy N, DO       amiodarone (PACERONE) tablet 200 mg  200 mg Oral BID Hudson, Caralyn, PA-C   200 mg at 01/19/23 0953   apixaban (ELIQUIS) tablet 5 mg  5 mg Oral BID Hudson, Caralyn, PA-C   5 mg at 01/19/23 8841   calcium-vitamin D (OSCAL WITH D) 500-5 MG-MCG per tablet 1 tablet  1 tablet Oral BID Marrion Coy, MD   1 tablet at 01/19/23 0951   [START ON 01/20/2023] furosemide (LASIX) tablet 20 mg  20 mg Oral Daily Hudson, Caralyn, PA-C       levothyroxine (SYNTHROID) tablet 50 mcg  50 mcg Oral QAC breakfast Cox, Amy N, DO   50  mcg at 01/19/23 0408   metoprolol succinate (TOPROL-XL) 24 hr tablet 50 mg  50 mg Oral BID Hudson, Caralyn, PA-C   50 mg at 01/19/23 0953   ondansetron (ZOFRAN) tablet 4 mg  4 mg Oral Q6H PRN Cox, Amy N, DO       Or   ondansetron (ZOFRAN) injection 4 mg  4 mg Intravenous Q6H PRN Cox, Amy N, DO       predniSONE (DELTASONE) tablet 7.5 mg  7.5 mg Oral Q breakfast Marrion Coy, MD   7.5 mg at 01/19/23 0951   senna-docusate (Senokot-S) tablet 1 tablet  1 tablet Oral QHS PRN Cox, Amy N, DO       sodium chloride flush (NS) 0.9 % injection 3 mL  3 mL Intravenous Q12H Callwood, Dwayne D, MD    3 mL at 01/19/23 4132     Discharge Medications: Please see discharge summary for a list of discharge medications.  Relevant Imaging Results:  Relevant Lab Results:   Additional Information SSN# [id number]  Truddie Hidden, RN

## 2023-01-19 NOTE — Plan of Care (Signed)
  Problem: Education: Goal: Knowledge of General Education information will improve Description: Including pain rating scale, medication(s)/side effects and non-pharmacologic comfort measures Outcome: Progressing   Problem: Health Behavior/Discharge Planning: Goal: Ability to manage health-related needs will improve Outcome: Progressing   Problem: Clinical Measurements: Goal: Ability to maintain clinical measurements within normal limits will improve Outcome: Progressing Goal: Will remain free from infection Outcome: Progressing Goal: Diagnostic test results will improve Outcome: Progressing Goal: Respiratory complications will improve Outcome: Progressing Goal: Cardiovascular complication will be avoided Outcome: Progressing   Problem: Activity: Goal: Risk for activity intolerance will decrease Outcome: Progressing   Problem: Nutrition: Goal: Adequate nutrition will be maintained Outcome: Progressing   Problem: Coping: Goal: Level of anxiety will decrease Outcome: Progressing   Problem: Elimination: Goal: Will not experience complications related to bowel motility Outcome: Progressing Goal: Will not experience complications related to urinary retention Outcome: Progressing   Problem: Pain Management: Goal: General experience of comfort will improve Outcome: Progressing   Problem: Safety: Goal: Ability to remain free from injury will improve Outcome: Progressing   Problem: Skin Integrity: Goal: Risk for impaired skin integrity will decrease Outcome: Progressing   Problem: Education: Goal: Understanding of CV disease, CV risk reduction, and recovery process will improve Outcome: Progressing Goal: Individualized Educational Video(s) Outcome: Progressing   Problem: Activity: Goal: Ability to return to baseline activity level will improve Outcome: Progressing   Problem: Cardiovascular: Goal: Ability to achieve and maintain adequate cardiovascular perfusion  will improve Outcome: Progressing Goal: Vascular access site(s) Level 0-1 will be maintained Outcome: Progressing   Problem: Health Behavior/Discharge Planning: Goal: Ability to safely manage health-related needs after discharge will improve Outcome: Progressing   Problem: Education: Goal: Ability to demonstrate management of disease process will improve Outcome: Progressing Goal: Ability to verbalize understanding of medication therapies will improve Outcome: Progressing Goal: Individualized Educational Video(s) Outcome: Progressing   Problem: Activity: Goal: Capacity to carry out activities will improve Outcome: Progressing   Problem: Cardiac: Goal: Ability to achieve and maintain adequate cardiopulmonary perfusion will improve Outcome: Progressing

## 2023-01-19 NOTE — Progress Notes (Signed)
Occupational Therapy Treatment Patient Details Name: Lindsay Arnold MRN: [id number] DOB: 01-13-1941 Today's Date: 01/19/2023   History of present illness Pt is an 82 yo female who presents to the emergency department from outpatient cardiology clinic for chief concerns of weakness and shortness of breath on exertion. PMH includes rheumatoid arthritis involving multiple sites, on long-term use of immunosuppression medication, chronic bedbound and wheelchair bound state due to severe RA, history of drug-induced leukopenia, B12 deficiency, hypothyroid.   OT comments  Pt is supine in bed on arrival. Pleasant and agreeable to OT session. She denies pain. Pt performed supine to sit t EOB with Min/Mod A for trunk management and to scoot to EOB. Min A needed for initial seated balance, then progressed to SUP. Pt declining transfer to recliner today stating "I need to ease into this, I just want to sit at EOB." HR up to 120 with transition to EOB and pt feeling sweaty, so deferred chair transfer. HR 101-111 while seated at EOB performing oral care with SUP. Attempted lateral scooting, but pt unable to grip bed rail or move. Min/Mod A to return to supine for BLE management. Pt continues to fatigue easily and demonstrate increased weakness and functional decline from baseline. Pt left in with all needs in place and will cont to require skilled acute OT services to maximize her safety and IND to return to PLOF.       If plan is discharge home, recommend the following:  A little help with bathing/dressing/bathroom;Assistance with cooking/housework;Assist for transportation;Help with stairs or ramp for entrance;A lot of help with walking and/or transfers   Equipment Recommendations  Hospital bed    Recommendations for Other Services      Precautions / Restrictions Precautions Precautions: Fall Restrictions Weight Bearing Restrictions: No RUE Weight Bearing: Non weight bearing       Mobility Bed  Mobility Overal bed mobility: Needs Assistance Bed Mobility: Supine to Sit, Sit to Supine     Supine to sit: Min assist, Mod assist, Used rails, HOB elevated Sit to supine: Min assist, Mod assist, Used rails   General bed mobility comments: Min/Mod A for trunk management and to scoot to EOB then Min/Mod A for BLE management to return to supine    Transfers                   General transfer comment: NT, pt declining transfer to recliner today stating "I need to ease into this, I just want to sit at EOB." HR up to 120 with transition to EOB and pt feeling sweaty, so deferred chair transfer     Balance Overall balance assessment: Needs assistance, History of Falls Sitting-balance support: Feet supported, Bilateral upper extremity supported Sitting balance-Leahy Scale: Fair Sitting balance - Comments: initally needed Min A to maintain balance but progressed to SUP                                   ADL either performed or assessed with clinical judgement   ADL Overall ADL's : Needs assistance/impaired     Grooming: Oral care;Supervision/safety;Sitting                                      Extremity/Trunk Assessment Upper Extremity Assessment RUE Deficits / Details: severe RA with deformities to hands/digits; limited shoulder ROM LUE Deficits /  Details: severe RA with deformities to hands/digits; limited shoulder ROM            Vision       Perception     Praxis      Cognition Arousal: Alert Behavior During Therapy: WFL for tasks assessed/performed Overall Cognitive Status: Within Functional Limits for tasks assessed                                          Exercises      Shoulder Instructions       General Comments fatigues easily with HR up to 120 with supine to sit at EOB and maintaining around 101-111 with EOB oral care    Pertinent Vitals/ Pain       Pain Assessment Pain Assessment: No/denies  pain Pain Intervention(s): Monitored during session  Home Living                                          Prior Functioning/Environment              Frequency  Min 1X/week        Progress Toward Goals  OT Goals(current goals can now be found in the care plan section)  Progress towards OT goals: Progressing toward goals  Acute Rehab OT Goals Patient Stated Goal: improve strength and endurance OT Goal Formulation: With patient/family Time For Goal Achievement: 01/31/23 Potential to Achieve Goals: Good  Plan      Co-evaluation                 AM-PAC OT "6 Clicks" Daily Activity     Outcome Measure   Help from another person eating meals?: None Help from another person taking care of personal grooming?: A Little Help from another person toileting, which includes using toliet, bedpan, or urinal?: A Lot Help from another person bathing (including washing, rinsing, drying)?: A Lot Help from another person to put on and taking off regular upper body clothing?: A Lot Help from another person to put on and taking off regular lower body clothing?: A Lot 6 Click Score: 15    End of Session    OT Visit Diagnosis: Other abnormalities of gait and mobility (R26.89);Muscle weakness (generalized) (M62.81)   Activity Tolerance Patient tolerated treatment well;Patient limited by fatigue   Patient Left in bed;with call bell/phone within reach;with bed alarm set;with family/visitor present   Nurse Communication          Time: 0981-1914 OT Time Calculation (min): 23 min  Charges: OT General Charges $OT Visit: 1 Visit OT Treatments $Self Care/Home Management : 8-22 mins $Therapeutic Activity: 8-22 mins  Mirella Gueye, OTR/L  01/19/23, 12:11 PM   Lindsay Arnold 01/19/2023, 12:09 PM

## 2023-01-19 NOTE — Assessment & Plan Note (Signed)
Appreciate podiatry consultation.  Patient has a toe ulcer with her toe deformities.  Fourth and fifth toes with Betadine and a small strip of calcium alginate with gauze.  Cover with dry gauze and wrap daily.

## 2023-01-19 NOTE — Progress Notes (Addendum)
Progress Note   Patient: Lindsay Arnold WUX:[id number] DOB: [birth year] DOA: 01/13/2023     6 DOS: the patient was seen and examined on 01/19/2023   Brief hospital course: Ms. Adalynne Fekete is a 82 year old female with history of rheumatoid arthritis involving multiple sites, on long-term use of immunosuppression medication, chronic bedbound and wheelchair bound state due to severe RA, history of drug-induced leukopenia, B12 deficiency, hypothyroid, who presents to the emergency department from outpatient cardiology clinic for chief concerns of weakness and shortness of breath on exertion. Lab results showed BNP 3311, troponin 35.  Patient is seen by cardiology, started on IV Lasix for exacerbation congestive heart failure. Echocardiogram showed ejection fraction 25 to 30%, grade 2 diastolic dysfunction.  Moderate mitral valve regurgitation. Heart cath performed on 11/19, showed near normal coronary arteries.  11/20.  Cardiology making adjustments to medications with switching flecainide over to amiodarone and adding metoprolol.  Patient points out a spot on her toe that did have some bleeding last week. 11/21.  Cardiology holding Lasix today and will reevaluate tomorrow and likely will start back on oral Lasix tomorrow.  Seen by podiatry and dressing changes ordered.  Assessment and Plan: * Acute on chronic systolic CHF (congestive heart failure) (HCC) Holding Lasix today.  Patient on Toprol-XL 50 mg twice a day.  Blood pressure limiting other medications currently.  Paroxysmal atrial fibrillation (HCC) Flecainide switched to amiodarone.  Patient also on Toprol-XL 50 mg twice a day.  Patient on Eliquis twice daily.  Rheumatoid arthritis involving multiple sites [facility]) Patient gets subcutaneous injections outpatient every 28 days.  Patient on chronic prednisone  Elevated troponin Borderline elevated troponin.  Cardiac catheterization negative.  Elevated troponin likely demand ischemia from  heart failure  Wheelchair dependence Patient able to pivot on her feet but unable to walk.  Hyponatremia Sodium 128.  Likely low with heart failure.  Toe ulcer, left, with fat layer exposed [facility]) Appreciate podiatry consultation.  Patient has a toe ulcer with her toe deformities.  Fourth and fifth toes with Betadine and a small strip of calcium alginate with gauze.  Cover with dry gauze and wrap daily.        Subjective: Patient feels okay.  Toe feels a little bit better today.  Ulcer more prominent on the top of her foot at the base of her toe.  No shortness of breath.  Physical Exam: Vitals:   01/18/23 2339 01/19/23 0414 01/19/23 0953 01/19/23 1012  BP: 103/67 123/75  (!) 118/58  Pulse:  (!) 53 (!) 102 (!) 54  Resp: 18 18  (!) 24  Temp: (!) 97.5 F (36.4 C) 97.6 F (36.4 C)  98.1 F (36.7 C)  TempSrc:    Oral  SpO2: 97% 98%  (!) 78%  Weight:      Height:       Physical Exam HENT:     Head: Normocephalic.     Mouth/Throat:     Pharynx: No oropharyngeal exudate.  Eyes:     General: Lids are normal.     Conjunctiva/sclera: Conjunctivae normal.  Cardiovascular:     Rate and Rhythm: Normal rate and regular rhythm.     Heart sounds: Normal heart sounds, S1 normal and S2 normal.  Pulmonary:     Breath sounds: No decreased breath sounds, wheezing, rhonchi or rales.  Abdominal:     Palpations: Abdomen is soft.     Tenderness: There is no abdominal tenderness.  Musculoskeletal:     Right lower leg: No  swelling.     Left lower leg: No swelling.     Comments: Foot and toe deformities secondary to rheumatoid arthritis.  Skin:    General: Skin is warm.     Comments: Ulcer on the top of the foot.  Neurological:     Mental Status: She is alert and oriented to person, place, and time.     Data Reviewed: Sodium 128, creatinine 0.72, sedimentation rate 4, x-ray of foot shows chronic findings.  No evidence of osteomyelitis.  Family Communication: Spoke with family at  bedside  Disposition: Status is: Inpatient Remains inpatient appropriate because: TOC to look into rehab beds.  Cardiology to adjust medications tomorrow.  Planned Discharge Destination: Rehab    Time spent: 28 minutes  Author: Alford Highland, MD 01/19/2023 1:55 PM  For on call review [email or web address].

## 2023-01-19 NOTE — Consult Note (Signed)
ORTHOPAEDIC CONSULTATION  REQUESTING PHYSICIAN: Alford Highland, MD  Chief Complaint: Wound left foot  HPI: Lindsay Arnold is a 82 y.o. female who complains of painful wound to her left foot.  Longstanding history of severe rheumatoid arthritis affecting both hands and feet.  She has undergone previous attempts surgical reconstructions to the lower extremities.  She has developed a draining wound between the left fourth and fifth toes.  There is redness to the area.  Past Medical History:  Diagnosis Date   Bedbound    Rheumatoid arthritis of hand (HCC)    Past Surgical History:  Procedure Laterality Date   ABDOMINAL HYSTERECTOMY     BACK SURGERY     BREAST BIOPSY Left    COLONOSCOPY     COLONOSCOPY WITH PROPOFOL N/A 04/23/2015   Procedure: COLONOSCOPY WITH PROPOFOL;  Surgeon: Wallace Cullens, MD;  Location: [facility] ENDOSCOPY;  Service: Gastroenterology;  Laterality: N/A;   DG TOES RT FOOT MULTIPLE SPECIF ([facility] HX) Right    reconstruction   JOINT REPLACEMENT     LEFT HEART CATH AND CORONARY ANGIOGRAPHY N/A 01/17/2023   Procedure: LEFT HEART CATH AND CORONARY ANGIOGRAPHY and possible PCI and stent;  Surgeon: Marcina Millard, MD;  Location: [facility] INVASIVE CV LAB;  Service: Cardiovascular;  Laterality: N/A;   ltk rtk rth  Bilateral    MR FOOT LEFT ([facility] HX)     ORIF FEMORAL NECK FRACTURE W/ DHS     POSTERIOR LAMINECTOMY / DECOMPRESSION CERVICAL SPINE     RIGHT AND LEFT HEART CATH N/A 01/17/2023   Procedure: RIGHT AND LEFT HEART CATH;  Surgeon: Marcina Millard, MD;  Location: [facility] INVASIVE CV LAB;  Service: Cardiovascular;  Laterality: N/A;   TONSILLECTOMY     Social History   Socioeconomic History   Marital status: Married    Spouse name: Not on file   Number of children: Not on file   Years of education: Not on file   Highest education level: Not on file  Occupational History   Not on file  Tobacco Use   Smoking status: Never   Smokeless tobacco: Never  Substance and  Sexual Activity   Alcohol use: Yes    Comment: 2 glasses of wine per year.   Drug use: Never   Sexual activity: Not Currently  Other Topics Concern   Not on file  Social History Narrative   Not on file   Social Determinants of Health   Financial Resource Strain: Low Risk  (01/18/2023)   Overall Financial Resource Strain (CARDIA)    Difficulty of Paying Living Expenses: Not hard at all  Food Insecurity: No Food Insecurity (01/13/2023)   Hunger Vital Sign    Worried About Running Out of Food in the Last Year: Never true    Ran Out of Food in the Last Year: Never true  Transportation Needs: No Transportation Needs (01/18/2023)   PRAPARE - Administrator, Civil Service (Medical): No    Lack of Transportation (Non-Medical): No  Physical Activity: Not on file  Stress: Not on file  Social Connections: Not on file   Family History  Problem Relation Age of Onset   Heart disease Mother    Heart disease Father    Breast cancer Paternal Grandmother    Allergies  Allergen Reactions   Celebrex [Celecoxib]    Penicillins    Abatacept Cough   Prior to Admission medications   Medication Sig Start Date End Date Taking? Authorizing Provider  acetaminophen (TYLENOL) 325  MG tablet Take 650 mg by mouth every 6 (six) hours as needed.   Yes [provider]  aspirin 81 MG tablet Take 81 mg by mouth daily.   Yes [provider]  azelastine (ASTELIN) 0.1 % nasal spray Place into both nostrils 2 (two) times daily. Use in each nostril as directed   Yes [provider]  calcium-vitamin D (OSCAL WITH D) 500-200 MG-UNIT tablet Take 1 tablet by mouth.   Yes [provider]  furosemide (LASIX) 20 MG tablet Take 20 mg by mouth daily. 01/09/23 01/09/24 Yes [provider]  hydroxychloroquine (PLAQUENIL) 200 MG tablet Take 200 mg by mouth daily.   Yes [provider]  hydroxypropyl methylcellulose / hypromellose (ISOPTO TEARS / GONIOVISC) 2.5  % ophthalmic solution 1 drop.   Yes [provider]  ipratropium-albuterol (DUONEB) 0.5-2.5 (3) MG/3ML SOLN Take 3 mLs by nebulization every 6 (six) hours as needed.   Yes [provider]  levothyroxine (SYNTHROID, LEVOTHROID) 50 MCG tablet Take 50 mcg by mouth daily before breakfast.   Yes [provider]  meloxicam (MOBIC) 7.5 MG tablet Take 7.5 mg by mouth daily.   Yes [provider]  multivitamin-lutein (OCUVITE-LUTEIN) CAPS capsule Take 1 capsule by mouth daily.   Yes [provider]  omeprazole (PRILOSEC) 10 MG capsule Take 10 mg by mouth daily.   Yes [provider]  predniSONE (DELTASONE) 5 MG tablet Take 7.5 mg by mouth daily with breakfast.   Yes [provider]  vitamin C (ASCORBIC ACID) 500 MG tablet Take 500 mg by mouth daily.   Yes [provider]  Abatacept (ORENCIA) 125 MG/ML SOSY Inject into the skin. Reported on 04/23/2015 Patient not taking: Reported on 01/13/2023    [provider]  certolizumab pegol (CIMZIA) 200 MG/ML prefilled syringe Inject 200 mg into the skin. Patient not taking: Reported on 01/14/2023    [provider]  Clindamycin HCl (CLINDAMYCIN MONOHYDRATE) powder Apply topically. Patient not taking: Reported on 01/14/2023    [provider]  leflunomide (ARAVA) 10 MG tablet Take 10 mg by mouth daily. Patient not taking: Reported on 01/14/2023    [provider]   DG Foot 2 Views Left  Result Date: 01/18/2023 CLINICAL DATA:  Left foot pain for 1 week. EXAM: LEFT FOOT - 2 VIEW COMPARISON:  None Available. FINDINGS: Extensive degenerative changes are seen involving the midfoot and hindfoot as well as the talotibial joint. Status post surgical posterior fusion of first metatarsophalangeal joint. Significant flexion deformity is seen involving the other 4 toes. Diffuse osteopenia is noted. Deformity of the distal portions of the second, third and fourth  metatarsals is noted suggesting possible psoriasis. IMPRESSION: Chronic findings as noted above. No definite acute abnormality is seen, although fracture osteomyelitis could be obscured due to the extensive deformity of this foot. Electronically Signed   By: Lupita Raider M.D.   On: 01/18/2023 19:12    Positive ROS: All other systems have been reviewed and were otherwise negative with the exception of those mentioned in the HPI and as above.  12 point ROS was performed.  Physical Exam: General: Alert and oriented.  No apparent distress.  Vascular:  Left foot:Dorsalis Pedis:  thready Posterior Tibial:  thready  Right foot: Dorsalis Pedis:  thready Posterior Tibial:  thready  Neuro:intact sensation  Derm: There is noted maceration with irritation and a superficial wound in the webspace of the left fourth toe.  Mild surrounding erythema from the drainage.  No purulent drainage.  No deep penetration of the wound to the bones at this time.  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  She has noted severe contracture of the lesser toes to both feet.  She is getting pressure between the fourth and fifth toes on the left foot causing irritation and maceration to the area.  Assessment: Maceration with ulceration left fourth toe  Plan: I recommended dressing changes.  I painted the wound with Betadine.  I recommended calcium alginate to absorb any fluid and gauze to the wound.  This should be changed daily.  She has a small wound on the right medial first MTPJ this looks to be a pressure induced lesion.  A silicone border foam pad can be applied to this area every other day.  She states she is currently nonambulatory which should help with the right great toe wound.  I suspect with time the wound between the fourth and fifth toes will resolve with routine dressing changes.  This can be formed  outpatient as well.  She can follow-up with podiatry in the next 3 to 4 weeks.    Irean Hong, DPM Cell [phone]   01/19/2023 12:45 PM

## 2023-01-19 NOTE — Progress Notes (Signed)
PT Cancellation Note  Patient Details Name: Lindsay Arnold MRN: [id number] DOB: 1940-07-27   Cancelled Treatment:    Reason Eval/Treat Not Completed: Other (comment). Pt declining PT this afternoon, reported fatigue. PT to re-attempt as able.    Olga Coaster PT, DPT 3:33 PM,01/19/23

## 2023-01-20 DIAGNOSIS — M069 Rheumatoid arthritis, unspecified: Secondary | ICD-10-CM | POA: Diagnosis not present

## 2023-01-20 DIAGNOSIS — I480 Paroxysmal atrial fibrillation: Secondary | ICD-10-CM | POA: Diagnosis not present

## 2023-01-20 DIAGNOSIS — I5023 Acute on chronic systolic (congestive) heart failure: Secondary | ICD-10-CM | POA: Diagnosis not present

## 2023-01-20 DIAGNOSIS — E871 Hypo-osmolality and hyponatremia: Secondary | ICD-10-CM | POA: Diagnosis not present

## 2023-01-20 DIAGNOSIS — G4700 Insomnia, unspecified: Secondary | ICD-10-CM | POA: Insufficient documentation

## 2023-01-20 DIAGNOSIS — K219 Gastro-esophageal reflux disease without esophagitis: Secondary | ICD-10-CM | POA: Insufficient documentation

## 2023-01-20 LAB — BASIC METABOLIC PANEL
Anion gap: 12 (ref 5–15)
BUN: 32 mg/dL — ABNORMAL HIGH (ref 8–23)
CO2: 26 mmol/L (ref 22–32)
Calcium: 8.5 mg/dL — ABNORMAL LOW (ref 8.9–10.3)
Chloride: 87 mmol/L — ABNORMAL LOW (ref 98–111)
Creatinine, Ser: 0.84 mg/dL (ref 0.44–1.00)
GFR, Estimated: >60 mL/min (ref >60)
Glucose, Bld: 108 mg/dL — ABNORMAL HIGH (ref 70–99)
Potassium: 4.7 mmol/L (ref 3.5–5.1)
Sodium: 125 mmol/L — ABNORMAL LOW (ref 135–145)

## 2023-01-20 LAB — SODIUM, URINE, RANDOM: Sodium, Ur: 17 mmol/L

## 2023-01-20 LAB — OSMOLALITY, URINE: Osmolality, Ur: 779 mosm/kg (ref 300–900)

## 2023-01-20 LAB — HIGH SENSITIVITY CRP: CRP, High Sensitivity: 76.64 mg/L — ABNORMAL HIGH (ref 0.00–3.00)

## 2023-01-20 MED ORDER — AMIODARONE HCL 200 MG PO TABS
400.0000 mg | ORAL_TABLET | Freq: Two times a day (BID) | ORAL | Status: DC
  Administered 2023-01-20 – 2023-01-23 (×6): 400 mg via ORAL
  Filled 2023-01-20 (×6): qty 2

## 2023-01-20 MED ORDER — ACETAMINOPHEN 650 MG RE SUPP: 650.0000 mg | Freq: Four times a day (QID) | RECTAL | Status: DC | PRN

## 2023-01-20 MED ORDER — AMIODARONE HCL 200 MG PO TABS: 200.0000 mg | ORAL_TABLET | Freq: Every day | ORAL | Status: DC

## 2023-01-20 MED ORDER — PANTOPRAZOLE SODIUM 20 MG PO TBEC
20.0000 mg | DELAYED_RELEASE_TABLET | Freq: Every day | ORAL | Status: DC
  Administered 2023-01-20: 20 mg via ORAL
  Filled 2023-01-20: qty 1

## 2023-01-20 MED ORDER — PANTOPRAZOLE SODIUM 20 MG PO TBEC
20.0000 mg | DELAYED_RELEASE_TABLET | Freq: Two times a day (BID) | ORAL | Status: DC
  Administered 2023-01-20 – 2023-01-23 (×6): 20 mg via ORAL
  Filled 2023-01-20 (×6): qty 1

## 2023-01-20 MED ORDER — TRAZODONE HCL 50 MG PO TABS
25.0000 mg | ORAL_TABLET | Freq: Every evening | ORAL | Status: DC | PRN
  Administered 2023-01-20: 25 mg via ORAL
  Filled 2023-01-20: qty 1

## 2023-01-20 MED ORDER — ACETAMINOPHEN 500 MG PO TABS
1000.0000 mg | ORAL_TABLET | Freq: Four times a day (QID) | ORAL | Status: DC | PRN
  Administered 2023-01-20 – 2023-01-21 (×3): 1000 mg via ORAL
  Filled 2023-01-20 (×3): qty 2

## 2023-01-20 MED ORDER — AMIODARONE HCL 200 MG PO TABS: 200.0000 mg | ORAL_TABLET | Freq: Two times a day (BID) | ORAL | Status: DC

## 2023-01-20 NOTE — Progress Notes (Signed)
[facility] CLINIC CARDIOLOGY PROGRESS NOTE   Patient ID: Lindsay Arnold MRN: [id number] DOB/AGE: [birth year] 82 y.o.  Admit date: 01/13/2023 Referring Physician Dr. Londell Moh Primary Physician Lynnea Ferrier, MD  Primary Cardiologist Dr. Melton Alar  Reason for Consultation shortness of breath, acute heart failure  HPI: Lindsay Arnold is a 82 y.o. female with a past medical history of severe rheumatoid arthritis, hypothyroidism who presented to the ED on 01/13/2023 for weakness, shortness of breath, abnormal lab work. Cardiology was consulted for further evaluation.   Interval History:  -Patient seen and examined this AM. States she is feeling well overall, did experience some SOB when working with OT yesterday but none this AM when she got OOB. -HR remains in the 90s with frequent PVCs. BP remains borderline.  -Appears euvolemic on exam. Transitioned to PO lasix today.  Review of systems complete and found to be negative unless listed above    Vitals:   01/19/23 2344 01/20/23 0527 01/20/23 0904 01/20/23 1002  BP: (!) 99/57 115/79 (!) 106/50   Pulse: 75 97 (!) 47 97  Resp: (!) 22 20    Temp:  (!) 97.5 F (36.4 C) 97.8 F (36.6 C)   TempSrc:      SpO2:  99% 98%   Weight:      Height:         Intake/Output Summary (Last 24 hours) at 01/20/2023 1304 Last data filed at 01/20/2023 1029 Gross per 24 hour  Intake 240 ml  Output 330 ml  Net -90 ml     PHYSICAL EXAM General: Chronically ill-appearing female, well nourished, in no acute distress laying nearly flat in hospital bed with family present at bedside. HEENT: Normocephalic and atraumatic. Neck: No JVD.  Lungs: Normal respiratory effort on room air. Clear bilaterally to auscultation. No wheezes, crackles, rhonchi.  Heart: Irregularly irregular. Normal S1 and S2 without gallops or murmurs. Radial & DP pulses 2+ bilaterally. Abdomen: Non-distended appearing.  Msk: Normal strength and tone for age. Extremities: No edema  bilaterally.  Neuro: Alert and oriented X 3. Psych: Mood appropriate, affect congruent.    LABS: Basic Metabolic Panel: Recent Labs    01/18/23 0518 01/19/23 0418 01/20/23 0503  NA 131* 128* 125*  K 4.1 4.5 4.7  CL 98 91* 87*  CO2 24 24 26   GLUCOSE 97 105* 108*  BUN 24* 26* 32*  CREATININE 0.59 0.72 0.84  CALCIUM 7.8* 8.2* 8.5*  MG 2.2  --   --    Liver Function Tests: No results for input(s): "AST", "ALT", "ALKPHOS", "BILITOT", "PROT", "ALBUMIN" in the last 72 hours. No results for input(s): "LIPASE", "AMYLASE" in the last 72 hours. CBC: Recent Labs    01/18/23 0518  WBC 7.7  HGB 11.7*  HCT 37.7  MCV 79.4*  PLT 264   Cardiac Enzymes: No results for input(s): "CKTOTAL", "CKMB", "CKMBINDEX", "TROPONINIHS" in the last 72 hours.  BNP: No results for input(s): "BNP" in the last 72 hours. D-Dimer: No results for input(s): "DDIMER" in the last 72 hours. Hemoglobin A1C: No results for input(s): "HGBA1C" in the last 72 hours. Fasting Lipid Panel: No results for input(s): "CHOL", "HDL", "LDLCALC", "TRIG", "CHOLHDL", "LDLDIRECT" in the last 72 hours. Thyroid Function Tests: No results for input(s): "TSH", "T4TOTAL", "T3FREE", "THYROIDAB" in the last 72 hours.  Invalid input(s): "FREET3" Anemia Panel: No results for input(s): "VITAMINB12", "FOLATE", "FERRITIN", "TIBC", "IRON", "RETICCTPCT" in the last 72 hours.  DG Foot 2 Views Left  Result Date: 01/18/2023 CLINICAL  DATA:  Left foot pain for 1 week. EXAM: LEFT FOOT - 2 VIEW COMPARISON:  None Available. FINDINGS: Extensive degenerative changes are seen involving the midfoot and hindfoot as well as the talotibial joint. Status post surgical posterior fusion of first metatarsophalangeal joint. Significant flexion deformity is seen involving the other 4 toes. Diffuse osteopenia is noted. Deformity of the distal portions of the second, third and fourth metatarsals is noted suggesting possible psoriasis. IMPRESSION: Chronic  findings as noted above. No definite acute abnormality is seen, although fracture osteomyelitis could be obscured due to the extensive deformity of this foot. Electronically Signed   By: Lupita Raider M.D.   On: 01/18/2023 19:12     ECHO as above  TELEMETRY reviewed by me 01/20/23: atrial fibrillation PVCs rate 90s  EKG reviewed by me 01/20/23: atrial fibrillation RVR PVCs rate 121 bpm  DATA reviewed by me 01/20/23: last 24h vitals tele labs imaging I/O, hospitalist progress note  Principal Problem:   Acute on chronic systolic CHF (congestive heart failure) (HCC) Active Problems:   Rheumatoid arthritis involving multiple sites [facility])   Personal history of immunosuppressive therapy   Elevated troponin   Wheelchair dependence   Hyponatremia   Paroxysmal atrial fibrillation (HCC)   Toe ulcer, left, with fat layer exposed (HCC)    ASSESSMENT AND PLAN: Lindsay Arnold is a 82 y.o. female with a past medical history of severe rheumatoid arthritis, hypothyroidism who presented to the ED on 01/13/2023 for weakness, shortness of breath, abnormal lab work. Cardiology was consulted for further evaluation.   # Acute heart failure reduced EF (EF 25-30% this admission) # Non-ischemic cardiomyopathy Patient with worsening SOB, mild orthopnea, LE edema for the last few weeks. BNP significantly elevated this admission at 4300. Trops 35 > 35. EF reduced at 25-30%, patient denies any hx of heart failure. R+LHC this admission with normal coronaries, nearly normal right-sided pressures. Suspect new cardiomyopathy 2/2 new onset AF RVR.  -Continue lasix 20 mg daily.  -Continue metoprolol succinate 50 mg twice daily. Further additions to GDMT limited at this time by borderline BP. Consider addition of spironolactone at follow up appointment pending BP and renal function.  -Strict I/Os and daily weights.    # Atrial fibrillation RVR # New onset atrial fibrillation Patient found to be in AF RVR on  admission, denies any prior history. Started on metoprolol for rate control.  -Continue eliquis 5 mg twice daily for stroke risk reduction. -Continue metoprolol 50 mg bid for rate control.  -Continue amiodarone 400 mg twice daily, orders for taper placed. Will consider cardioversion outpatient after follow up appointment if she remains in atrial fibrillation.   # Rheumatoid arthritis Patient with longstanding hx of rheumatoid arthritis s/p multiple therapies now wheelchair bound.  -Management per primary. -Consider [facility] outpatient for additional evaluation as some of her prior therapies for rheumatoid arthritis can cause heart failure.    This patient's case was discussed and created with Dr. Darrold Junker and he is in agreement.  Signed:  Gale Journey, PA-C  01/20/2023, 1:04 PM [facility] Cardiology

## 2023-01-20 NOTE — Plan of Care (Signed)
  Problem: Education: Goal: Knowledge of General Education information will improve Description: Including pain rating scale, medication(s)/side effects and non-pharmacologic comfort measures Outcome: Progressing   Problem: Health Behavior/Discharge Planning: Goal: Ability to manage health-related needs will improve Outcome: Progressing   Problem: Clinical Measurements: Goal: Ability to maintain clinical measurements within normal limits will improve Outcome: Progressing Goal: Will remain free from infection Outcome: Progressing Goal: Diagnostic test results will improve Outcome: Progressing Goal: Respiratory complications will improve Outcome: Progressing Goal: Cardiovascular complication will be avoided Outcome: Progressing   Problem: Activity: Goal: Risk for activity intolerance will decrease Outcome: Progressing   Problem: Nutrition: Goal: Adequate nutrition will be maintained Outcome: Progressing   Problem: Coping: Goal: Level of anxiety will decrease Outcome: Progressing   Problem: Elimination: Goal: Will not experience complications related to bowel motility Outcome: Progressing Goal: Will not experience complications related to urinary retention Outcome: Progressing   Problem: Pain Management: Goal: General experience of comfort will improve Outcome: Progressing   Problem: Safety: Goal: Ability to remain free from injury will improve Outcome: Progressing   Problem: Skin Integrity: Goal: Risk for impaired skin integrity will decrease Outcome: Progressing   Problem: Education: Goal: Understanding of CV disease, CV risk reduction, and recovery process will improve Outcome: Progressing Goal: Individualized Educational Video(s) Outcome: Progressing   Problem: Activity: Goal: Ability to return to baseline activity level will improve Outcome: Progressing   Problem: Cardiovascular: Goal: Ability to achieve and maintain adequate cardiovascular perfusion  will improve Outcome: Progressing Goal: Vascular access site(s) Level 0-1 will be maintained Outcome: Progressing   Problem: Health Behavior/Discharge Planning: Goal: Ability to safely manage health-related needs after discharge will improve Outcome: Progressing   Problem: Education: Goal: Ability to demonstrate management of disease process will improve Outcome: Progressing Goal: Ability to verbalize understanding of medication therapies will improve Outcome: Progressing Goal: Individualized Educational Video(s) Outcome: Progressing   Problem: Activity: Goal: Capacity to carry out activities will improve Outcome: Progressing   Problem: Cardiac: Goal: Ability to achieve and maintain adequate cardiopulmonary perfusion will improve Outcome: Progressing

## 2023-01-20 NOTE — Assessment & Plan Note (Signed)
Placed back on PPI.

## 2023-01-20 NOTE — Progress Notes (Signed)
Physical Therapy Treatment Patient Details Name: Lindsay Arnold MRN: [id number] DOB: 06/14/1940 Today's Date: 01/20/2023   History of Present Illness Pt is an 82 yo female who presents to the emergency department from outpatient cardiology clinic for chief concerns of weakness and shortness of breath on exertion. PMH includes rheumatoid arthritis involving multiple sites, on long-term use of immunosuppression medication, chronic bedbound and wheelchair bound state due to severe RA, history of drug-induced leukopenia, B12 deficiency, hypothyroid.    PT Comments  Pt alert, agreeable to PT with some encouragement. Reported generalized arthritic pain, 3/10 during session. Supine to sit with minAx2 including to reposition to EOB but once safely positioned, supervision for sitting. Sit <> Stand twice, minAx2 and minA from elevated surface. Able to stand with BUE propped on sink for 1-3 minutes prior to fatiguing. Returned to supine with CGA, extended time. The patient would benefit from further skilled PT intervention to continue to progress towards goals.     If plan is discharge home, recommend the following: Two people to help with walking and/or transfers;Assistance with feeding;Help with stairs or ramp for entrance;Assist for transportation;Assistance with cooking/housework;Two people to help with bathing/dressing/bathroom   Can travel by private vehicle     No  Equipment Recommendations  None recommended by PT    Recommendations for Other Services       Precautions / Restrictions Precautions Precautions: Fall Restrictions Weight Bearing Restrictions: No     Mobility  Bed Mobility Overal bed mobility: Needs Assistance Bed Mobility: Supine to Sit, Sit to Supine     Supine to sit: Min assist, +2 for safety/equipment, HOB elevated Sit to supine: Contact guard assist        Transfers Overall transfer level: Needs assistance   Transfers: Sit to/from Stand Sit to Stand: Min  assist, +2 safety/equipment           General transfer comment: minAx2 first attempt, minA from elevated surface for second attempt. pt anxious but able to do more than she anticipated    Ambulation/Gait                   Stairs             Wheelchair Mobility     Tilt Bed    Modified Rankin (Stroke Patients Only)       Balance Overall balance assessment: Needs assistance, History of Falls Sitting-balance support: Feet supported, Bilateral upper extremity supported Sitting balance-Leahy Scale: Fair Sitting balance - Comments: initally needed Min A to achieve midline but supervision once positioned safely   Standing balance support: Bilateral upper extremity supported Standing balance-Leahy Scale: Poor Standing balance comment: BUE support on sink                            Cognition Arousal: Alert Behavior During Therapy: WFL for tasks assessed/performed Overall Cognitive Status: Within Functional Limits for tasks assessed                                          Exercises      General Comments        Pertinent Vitals/Pain Pain Assessment Pain Assessment: 0-10 Pain Score: 3  Pain Location: generalized RA pain Pain Descriptors / Indicators: Aching Pain Intervention(s): Limited activity within patient's tolerance, Monitored during session, Repositioned    Home Living  Prior Function            PT Goals (current goals can now be found in the care plan section) Progress towards PT goals: Progressing toward goals    Frequency    Min 1X/week      PT Plan      Co-evaluation PT/OT/SLP Co-Evaluation/Treatment: Yes Reason for Co-Treatment: To address functional/ADL transfers;For patient/therapist safety PT goals addressed during session: Mobility/safety with mobility OT goals addressed during session: ADL's and self-care      AM-PAC PT "6 Clicks" Mobility   Outcome  Measure  Help needed turning from your back to your side while in a flat bed without using bedrails?: A Little Help needed moving from lying on your back to sitting on the side of a flat bed without using bedrails?: A Little Help needed moving to and from a bed to a chair (including a wheelchair)?: A Little Help needed standing up from a chair using your arms (e.g., wheelchair or bedside chair)?: A Lot Help needed to walk in hospital room?: Total Help needed climbing 3-5 steps with a railing? : Total 6 Click Score: 13    End of Session Equipment Utilized During Treatment: Gait belt Activity Tolerance: Patient tolerated treatment well Patient left: in bed;with call bell/phone within reach;with bed alarm set Nurse Communication: Mobility status PT Visit Diagnosis: Unsteadiness on feet (R26.81);Other abnormalities of gait and mobility (R26.89);History of falling (Z91.81);Muscle weakness (generalized) (M62.81)     Time: 2130-8657 PT Time Calculation (min) (ACUTE ONLY): 23 min  Charges:    $Therapeutic Activity: 8-22 mins PT General Charges $$ ACUTE PT VISIT: 1 Visit                     Olga Coaster PT, DPT 3:03 PM,01/20/23

## 2023-01-20 NOTE — Progress Notes (Signed)
Occupational Therapy Treatment Patient Details Name: Lindsay Arnold MRN: [id number] DOB: 1940/12/31 Today's Date: 01/20/2023   History of present illness Pt is an 82 yo female who presents to the emergency department from outpatient cardiology clinic for chief concerns of weakness and shortness of breath on exertion. PMH includes rheumatoid arthritis involving multiple sites, on long-term use of immunosuppression medication, chronic bedbound and wheelchair bound state due to severe RA, history of drug-induced leukopenia, B12 deficiency, hypothyroid.   OT comments  Pt is supine in bed on arrival. Pleasant and agreeable to OT session. She reports minimal pain from her RA. Pt performed bed mobility with Min A x2 for trunk management and scooting to EOB. Sup for seated balance once position achieved with therapists assisting to have her back washed and lotion applied. X2 STS from EOB performed with it elevated with Min A x2 and Min A on second attempt. Pt requiring BUE support on sink to maintain balance <60 seconds. Required Max A for LB bathing of buttocks/peri-area. Supervision to perform oral care seated at EOB. HR WFL throughout session, however she continues to fatigue easily and demonstrate increased weakness. Pt returned to bed with all needs in place and will cont to require skilled acute OT services to maximize her safety and IND to return to PLOF.       If plan is discharge home, recommend the following:  A little help with bathing/dressing/bathroom;Assistance with cooking/housework;Assist for transportation;Help with stairs or ramp for entrance;A lot of help with walking and/or transfers   Equipment Recommendations  Hospital bed    Recommendations for Other Services      Precautions / Restrictions Precautions Precautions: Fall Restrictions Weight Bearing Restrictions: No       Mobility Bed Mobility Overal bed mobility: Needs Assistance Bed Mobility: Supine to Sit, Sit to  Supine Rolling: Contact guard assist   Supine to sit: Min assist, +2 for safety/equipment, HOB elevated Sit to supine: Contact guard assist   General bed mobility comments: Min Ax2 for trunk management and to scoot to EOB then CGA to return to bed    Transfers Overall transfer level: Needs assistance   Transfers: Sit to/from Stand Sit to Stand: Min assist, +2 safety/equipment           General transfer comment: Min A x2 for STS from EOB elevated, able to stand at sink for <60 seconds; Min A x1 for 2nd attempt with PT while OT performed hygiene to buttocks     Balance Overall balance assessment: Needs assistance, History of Falls Sitting-balance support: Feet supported, Bilateral upper extremity supported Sitting balance-Leahy Scale: Fair Sitting balance - Comments: Min A to get fully squared with feet on floor, sup once gained balance/position   Standing balance support: Bilateral upper extremity supported Standing balance-Leahy Scale: Poor Standing balance comment: BUE support on sink and CGA/Min A from therapists                           ADL either performed or assessed with clinical judgement   ADL Overall ADL's : Needs assistance/impaired     Grooming: Oral care;Supervision/safety;Sitting       Lower Body Bathing: Total assistance;Maximal assistance;Sit to/from stand       Lower Body Dressing: Maximal assistance                      Extremity/Trunk Assessment  Vision       Perception     Praxis      Cognition Arousal: Alert Behavior During Therapy: WFL for tasks assessed/performed Overall Cognitive Status: Within Functional Limits for tasks assessed                                          Exercises      Shoulder Instructions       General Comments HR WFL today with all activity    Pertinent Vitals/ Pain       Pain Assessment Pain Assessment: 0-10 Pain Score: 3  Pain Location:  generalized RA pain Pain Descriptors / Indicators: Aching Pain Intervention(s): Monitored during session, Limited activity within patient's tolerance  Home Living                                          Prior Functioning/Environment              Frequency  Min 1X/week        Progress Toward Goals  OT Goals(current goals can now be found in the care plan section)  Progress towards OT goals: Progressing toward goals  Acute Rehab OT Goals Patient Stated Goal: improve strength and endurance OT Goal Formulation: With patient/family Time For Goal Achievement: 01/31/23 Potential to Achieve Goals: Good  Plan      Co-evaluation    PT/OT/SLP Co-Evaluation/Treatment: Yes Reason for Co-Treatment: To address functional/ADL transfers;For patient/therapist safety PT goals addressed during session: Mobility/safety with mobility OT goals addressed during session: ADL's and self-care      AM-PAC OT "6 Clicks" Daily Activity     Outcome Measure   Help from another person eating meals?: None Help from another person taking care of personal grooming?: A Little Help from another person toileting, which includes using toliet, bedpan, or urinal?: A Lot Help from another person bathing (including washing, rinsing, drying)?: A Lot Help from another person to put on and taking off regular upper body clothing?: A Lot Help from another person to put on and taking off regular lower body clothing?: A Lot 6 Click Score: 15    End of Session Equipment Utilized During Treatment: Gait belt  OT Visit Diagnosis: Other abnormalities of gait and mobility (R26.89);Muscle weakness (generalized) (M62.81)   Activity Tolerance Patient tolerated treatment well;Patient limited by fatigue   Patient Left in bed;with call bell/phone within reach;with bed alarm set;with family/visitor present   Nurse Communication          Time: 1610-9604 OT Time Calculation (min): 23  min  Charges: OT General Charges $OT Visit: 1 Visit OT Treatments $Self Care/Home Management : 8-22 mins  Matayah Reyburn, OTR/L  01/20/23, 4:06 PM   Oswald Pott [facility] Summerson 01/20/2023, 4:01 PM

## 2023-01-20 NOTE — Plan of Care (Signed)
?  Problem: Clinical Measurements: ?Goal: Ability to maintain clinical measurements within normal limits will improve ?Outcome: Progressing ?Goal: Will remain free from infection ?Outcome: Progressing ?  ?Problem: Activity: ?Goal: Risk for activity intolerance will decrease ?Outcome: Not Progressing ?  ?

## 2023-01-20 NOTE — TOC Progression Note (Addendum)
Transition of Care [facility]) - Progression Note    Patient Details  Name: Lindsay Arnold MRN: [id number] Date of Birth: 01/04/1941  [facility] CM/SW Contact  Truddie Hidden, RN Phone Number: 01/20/2023, 11:25 AM  Clinical Narrative:    Sherron Monday with Tiffany, Admissions Coordinator, from [facility] regarding status of referral. Patient bed offer given however no bed availability prior to Monday of next week.   Spoke with Sue Lush, Admissions Coordinator from [facility]. No bed availibily before next early next week.    1:30pm Spoke with patient and her Son, Lindsay Arnold, at the bedside. Bed offers for [facility] and [facility] were given. Patient chose [facility]. She was advised bed is not available before Monday. MD notified.         Expected Discharge Plan and Services                                               Social Determinants of Health (SDOH) Interventions SDOH Screenings   Food Insecurity: No Food Insecurity (01/13/2023)  Housing: Low Risk  (01/18/2023)  Transportation Needs: No Transportation Needs (01/18/2023)  Utilities: Not At Risk (01/13/2023)  Financial Resource Strain: Low Risk  (01/18/2023)  Tobacco Use: Low Risk  (01/13/2023)    Readmission Risk Interventions     No data to display

## 2023-01-20 NOTE — Progress Notes (Signed)
Heart Failure Stewardship Pharmacy Note  PCP: Lynnea Ferrier, MD PCP-Cardiologist: None  HPI: Lindsay Arnold is a 82 y.o. female with severe rheumatoid arthritis being treated with Plaquenil and Cimzia, hypothyroidism who presented with weakness, shortness of breath on exertion and swelling that progressed over the past few weeks.  On admission, BNP was significantly elevated to 4311.9 and HS-troponin was 35. CXR on admission showed pulmonary edema with small bilateral pleural effusions. CTA PE showed the same with no evidence of PE. Echo on 01/14/23 showed LVEF of 25-30% with mdoerately reduced RV function with moderate TR, AR, and MR. RHC/RHC on 01/17/23 showed low CO/CI of 3.5/2, RA of 13, PW of 18, MPAP of 28, and nonobstructive coronary arteries.   Pertinent Lab Values: Creatinine  Date Value Ref Range Status  10/26/2011 0.50 (L) 0.60 - 1.30 mg/dL Final   Creatinine, Ser  Date Value Ref Range Status  01/20/2023 0.84 0.44 - 1.00 mg/dL Final   BUN  Date Value Ref Range Status  01/20/2023 32 (H) 8 - 23 mg/dL Final  18/84/1660 8 7 - 18 mg/dL Final   Potassium  Date Value Ref Range Status  01/20/2023 4.7 3.5 - 5.1 mmol/L Final  10/26/2011 3.7 3.5 - 5.1 mmol/L Final   Sodium  Date Value Ref Range Status  01/20/2023 125 (L) 135 - 145 mmol/L Final  10/26/2011 141 136 - 145 mmol/L Final   B Natriuretic Peptide  Date Value Ref Range Status  01/13/2023 4,311.9 (H) 0.0 - 100.0 pg/mL Final    Comment:    Performed at [facility], [address] [place], [place] [address]   Magnesium  Date Value Ref Range Status  01/18/2023 2.2 1.7 - 2.4 mg/dL Final    Comment:    Performed at [facility], [address] [place], [place] [address]    Vital Signs:  Temp:  [97.4 F (36.3 C)-98.3 F (36.8 C)] 97.5 F (36.4 C) (11/22 0527) Pulse Rate:  [54-103] 97 (11/22 0527) Cardiac Rhythm: Atrial fibrillation (11/22 0809) Resp:  [20-24] 20 (11/22 0527) BP:  (88-118)/(50-79) 115/79 (11/22 0527) SpO2:  [98 %-100 %] 99 % (11/22 0527)  Intake/Output Summary (Last 24 hours) at 01/20/2023 0844 Last data filed at 01/20/2023 2355 Gross per 24 hour  Intake --  Output 300 ml  Net -300 ml   Current Heart Failure Medications:  Loop diuretic: furosemide 40 mg IV daily Beta-Blocker: metoprolol succinate 50 mg BID ACEI/ARB/ARNI: none MRA: none SGLT2i: none Other: none  Prior to admission Heart Failure Medications:  Loop diuretic: furosemide 20 mg daily Beta-Blocker: none ACEI/ARB/ARNI: none MRA: none SGLT2i: none Other: none  Assessment: 1. Acute systolic heart failure (LVEF 25-30%) with moderately reduced RV function, due to unknown etiology. NYHA class III symptoms.  -Symptoms: Denies shortness of breath at rest, but does experience it working with PT. Mild LEE present. Reported some nausea this morning. Could be due to steroids. -Volume: Appears to be euvolemic. Urine color is dark now.  Creatinine and BUN continue to trend up. CO2 also trending up. Together, labs suggest patient is intravascularly dry. LEE could likely be helped with TED hose. starting home furosemide 20 mg daily tomorrow. -Hemodynamics: BP stable this AM with two low readings overnight. Pulse is stable. -BB: Metoprolol succinate 50 mg BID. Rate is better controlled today. Will likely need dose reduction if converts to NSR on amiodarone. -ACEI/ARB/ARNI: BP remains on the softer side, but appears to be improving. Can consider adding when BP  is stable. -MRA: K is up to 4.7 after previously being difficult to replenish. Can consider adding spironolactone 12.5 mg daily tomorrow if SBP stays ~110.  -SGLT2i: May not be an ideal candidate given recent decline in ambulation as this may reduce mobility to the restroom. -Cimzia and hydroxychloroquine can both cause/worsen heart failure. Recommend close follow-up and reevaluation of medications for RA outpatient. -Frequent PVCs noted,  will monitor on metoprolol and amiodarone.  Plan: 1) Medication changes recommended at this time: -Can consider TED hose for lower extremity edema  2) Patient assistance: -Copay for Eliquis, Farxiga, and Jardiance are $47  3) Education: - Patient has been educated on current HF medications and potential additions to HF medication regimen - Patient verbalizes understanding that over the next few months, these medication doses may change and more medications may be added to optimize HF regimen - Patient has been educated on basic disease state pathophysiology and goals of therapy  Medication Assistance / Insurance Benefits Check: Does the patient have prescription insurance?    Type of insurance plan:  Does the patient qualify for medication assistance through manufacturers or grants? Pending   Outpatient Pharmacy: Prior to admission outpatient pharmacy: [facility]     Please do not hesitate to reach out with questions or concerns,  Enos Fling, PharmD, CPP, BCPS Heart Failure Pharmacist  Phone - [phone] 01/20/2023 8:44 AM

## 2023-01-20 NOTE — Progress Notes (Addendum)
Progress Note   Patient: Lindsay Arnold OYD:[id number] DOB: 1940-09-03 DOA: 01/13/2023     7 DOS: the patient was seen and examined on 01/20/2023   Brief hospital course: Lindsay Arnold is a 82 year old female with history of rheumatoid arthritis involving multiple sites, on long-term use of immunosuppression medication, chronic bedbound and wheelchair bound state due to severe RA, history of drug-induced leukopenia, B12 deficiency, hypothyroid, who presents to the emergency department from outpatient cardiology clinic for chief concerns of weakness and shortness of breath on exertion. Lab results showed BNP 3311, troponin 35.  Patient is seen by cardiology, started on IV Lasix for exacerbation congestive heart failure. Echocardiogram showed ejection fraction 25 to 30%, grade 2 diastolic dysfunction.  Moderate mitral valve regurgitation. Heart cath performed on 11/19, showed near normal coronary arteries.  11/20.  Cardiology making adjustments to medications with switching flecainide over to amiodarone and adding metoprolol.  Patient points out a spot on her toe that did have some bleeding last week. 11/21.  Cardiology holding Lasix today and will reevaluate tomorrow and likely will start back on oral Lasix tomorrow.  Seen by podiatry and dressing changes ordered. 11/22.  Patient not sleeping very well at night.  Feels tired.  Sodium down to 135.  Does not feel hungry.  Placed back on her PPI.  Assessment and Plan: * Acute on chronic systolic CHF (congestive heart failure) (HCC) Low-dose Lasix.  Patient on Toprol-XL 50 mg twice a day.  Blood pressure limiting other medications currently.  Check orthostatic vital signs.  Hyponatremia Sodium 125.  Encourage patient to eat better.  Paroxysmal atrial fibrillation (HCC) Continue amiodarone.  Patient also on Toprol-XL 50 mg twice a day.  Patient on Eliquis twice daily.  Rheumatoid arthritis involving multiple sites [facility]) Patient gets  subcutaneous injections outpatient every 28 days.  Patient on chronic prednisone  Elevated troponin Borderline elevated troponin.  Cardiac catheterization negative.  Elevated troponin likely demand ischemia from heart failure  Wheelchair dependence Patient able to pivot on her feet but unable to walk.  Insomnia As needed trazodone  GERD (gastroesophageal reflux disease) Placed back on PPI.  Will do twice a day for right now.  Toe ulcer, left, with fat layer exposed [facility]) Appreciate podiatry consultation.  Patient has a toe ulcer with her toe deformities.  Fourth and fifth toes with Betadine and a small strip of calcium alginate with gauze.  Cover with dry gauze and wrap daily.        Subjective: Patient feeling tired.  Has not slept very well.  Belching a lot.  Yesterday had symptoms with standing up.  Physical Exam: Vitals:   01/20/23 0527 01/20/23 0904 01/20/23 1002 01/20/23 1301  BP: 115/79 (!) 106/50  (!) 129/117  Pulse: 97 (!) 47 97 94  Resp: 20   (!) 22  Temp: (!) 97.5 F (36.4 C) 97.8 F (36.6 C)    TempSrc:      SpO2: 99% 98%  98%  Weight:      Height:       Physical Exam HENT:     Head: Normocephalic.     Mouth/Throat:     Pharynx: No oropharyngeal exudate.  Eyes:     General: Lids are normal.     Conjunctiva/sclera: Conjunctivae normal.  Cardiovascular:     Rate and Rhythm: Normal rate and regular rhythm.     Heart sounds: Normal heart sounds, S1 normal and S2 normal.  Pulmonary:     Breath sounds: No decreased  breath sounds, wheezing, rhonchi or rales.  Abdominal:     Palpations: Abdomen is soft.     Tenderness: There is no abdominal tenderness.  Musculoskeletal:     Right lower leg: No swelling.     Left lower leg: No swelling.     Comments: Foot and toe deformities secondary to rheumatoid arthritis.  Skin:    General: Skin is warm.     Comments: Foot covered.  Neurological:     Mental Status: She is alert and oriented to person, place, and  time.     Data Reviewed: Urine osmolality 779 urine sodium 17, sodium 125, BUN 32, creatinine 0.84  Family Communication: Spoke with family at the bedside  Disposition: Status is: Inpatient Remains inpatient appropriate because: Awaiting acceptance to rehab  Planned Discharge Destination: Rehab    Time spent: 28 minutes  Author: Alford Highland, MD 01/20/2023 2:20 PM  For on call review [email or web address].

## 2023-01-20 NOTE — Assessment & Plan Note (Signed)
As needed trazodone

## 2023-01-21 DIAGNOSIS — E871 Hypo-osmolality and hyponatremia: Secondary | ICD-10-CM | POA: Diagnosis not present

## 2023-01-21 DIAGNOSIS — N179 Acute kidney failure, unspecified: Secondary | ICD-10-CM | POA: Diagnosis not present

## 2023-01-21 DIAGNOSIS — I480 Paroxysmal atrial fibrillation: Secondary | ICD-10-CM | POA: Diagnosis not present

## 2023-01-21 DIAGNOSIS — G4709 Other insomnia: Secondary | ICD-10-CM

## 2023-01-21 DIAGNOSIS — I5023 Acute on chronic systolic (congestive) heart failure: Secondary | ICD-10-CM | POA: Diagnosis not present

## 2023-01-21 LAB — HEPATIC FUNCTION PANEL
ALT: 10 U/L (ref 0–44)
AST: 24 U/L (ref 15–41)
Albumin: 3.1 g/dL — ABNORMAL LOW (ref 3.5–5.0)
Alkaline Phosphatase: 85 U/L (ref 38–126)
Bilirubin, Direct: 0.5 mg/dL — ABNORMAL HIGH (ref 0.0–0.2)
Indirect Bilirubin: 0.8 mg/dL (ref 0.3–0.9)
Total Bilirubin: 1.3 mg/dL — ABNORMAL HIGH (ref <1.2)
Total Protein: 6.2 g/dL — ABNORMAL LOW (ref 6.5–8.1)

## 2023-01-21 LAB — BASIC METABOLIC PANEL
Anion gap: 10 (ref 5–15)
Anion gap: 14 (ref 5–15)
BUN: 30 mg/dL — ABNORMAL HIGH (ref 8–23)
BUN: 31 mg/dL — ABNORMAL HIGH (ref 8–23)
CO2: 26 mmol/L (ref 22–32)
CO2: 24 mmol/L (ref 22–32)
Calcium: 8 mg/dL — ABNORMAL LOW (ref 8.9–10.3)
Calcium: 8.3 mg/dL — ABNORMAL LOW (ref 8.9–10.3)
Chloride: 86 mmol/L — ABNORMAL LOW (ref 98–111)
Chloride: 87 mmol/L — ABNORMAL LOW (ref 98–111)
Creatinine, Ser: 1.07 mg/dL — ABNORMAL HIGH (ref 0.44–1.00)
Creatinine, Ser: 0.91 mg/dL (ref 0.44–1.00)
GFR, Estimated: 52 mL/min — ABNORMAL LOW (ref >60)
GFR, Estimated: >60 mL/min (ref >60)
Glucose, Bld: 102 mg/dL — ABNORMAL HIGH (ref 70–99)
Glucose, Bld: 140 mg/dL — ABNORMAL HIGH (ref 70–99)
Potassium: 4.9 mmol/L (ref 3.5–5.1)
Potassium: 4.5 mmol/L (ref 3.5–5.1)
Sodium: 122 mmol/L — ABNORMAL LOW (ref 135–145)
Sodium: 125 mmol/L — ABNORMAL LOW (ref 135–145)

## 2023-01-21 LAB — SODIUM: Sodium: 123 mmol/L — ABNORMAL LOW (ref 135–145)

## 2023-01-21 MED ORDER — METHYLPREDNISOLONE SODIUM SUCC 40 MG IJ SOLR
40.0000 mg | Freq: Every day | INTRAMUSCULAR | Status: DC
  Administered 2023-01-21 – 2023-01-23 (×3): 40 mg via INTRAVENOUS
  Filled 2023-01-21 (×3): qty 1

## 2023-01-21 MED ORDER — ACETAMINOPHEN 500 MG PO TABS
1000.0000 mg | ORAL_TABLET | Freq: Four times a day (QID) | ORAL | Status: DC | PRN
  Administered 2023-01-21 – 2023-01-22 (×2): 1000 mg via ORAL
  Filled 2023-01-21 (×2): qty 2

## 2023-01-21 MED ORDER — TOLVAPTAN 15 MG PO TABS
15.0000 mg | ORAL_TABLET | Freq: Once | ORAL | Status: AC
Start: 2023-01-21 — End: 2023-01-21
  Administered 2023-01-21: 15 mg via ORAL
  Filled 2023-01-21: qty 1

## 2023-01-21 NOTE — Plan of Care (Signed)
  Problem: Education: Goal: Knowledge of General Education information will improve Description: Including pain rating scale, medication(s)/side effects and non-pharmacologic comfort measures Outcome: Progressing   Problem: Health Behavior/Discharge Planning: Goal: Ability to manage health-related needs will improve Outcome: Progressing   Problem: Clinical Measurements: Goal: Ability to maintain clinical measurements within normal limits will improve Outcome: Progressing   Problem: Activity: Goal: Risk for activity intolerance will decrease Outcome: Progressing   Problem: Coping: Goal: Level of anxiety will decrease Outcome: Progressing   Problem: Elimination: Goal: Will not experience complications related to bowel motility Outcome: Progressing Goal: Will not experience complications related to urinary retention Outcome: Progressing   Problem: Pain Management: Goal: General experience of comfort will improve Outcome: Progressing   Problem: Activity: Goal: Ability to return to baseline activity level will improve Outcome: Progressing   Problem: Cardiovascular: Goal: Ability to achieve and maintain adequate cardiovascular perfusion will improve Outcome: Progressing   Problem: Cardiac: Goal: Ability to achieve and maintain adequate cardiopulmonary perfusion will improve Outcome: Progressing

## 2023-01-21 NOTE — Consult Note (Signed)
PHARMACY CONSULT NOTE - Tolvaptan  Pharmacy Consult for Tolvaptan Monitoring  Recent Labs: Potassium (mmol/L)  Date Value  01/21/2023 4.9  10/26/2011 3.7   Magnesium (mg/dL)  Date Value  23/76/2831 2.2   Calcium (mg/dL)  Date Value  51/76/1607 8.0 (L)   Calcium, Total (mg/dL)  Date Value  37/11/6267 7.7 (L)   Albumin (g/dL)  Date Value  48/54/6270 3.2 (L)   Sodium (mmol/L)  Date Value  01/21/2023 122 (L)  10/26/2011 141   Assessment  Lindsay Arnold is a 82 y.o. female presenting with weakness and shortness of breath on exertion. PMH significant for rheumatoid arthritis involving multiple sites, on long-term use of immunosuppression medication, chronic bedbound and wheelchair bound state due to severe RA, history of drug-induced leukopenia, B12 deficiency, and hypothyroid. Patient found to have serum Na of 130 on admission. It remained stable in the 130s for the first few days of admission but has down trended to 122 today. Pharmacy has been consulted to monitor tolvaptan.  Drug interactions: N/A  Goal of Therapy:  Na >130 Do not exceed increase in Na by 8 mEq/L per 8 hours or 12 mEq/L in 24 hours  Plan:  Give tolvaptan 15 mg PO x 1 Check Na Q8H x 2 then daily thereafter Continue to monitor for signs of clinical improvement and recommendations per nephrology  Littie Deeds, PharmD Pharmacy Resident  01/21/2023 7:57 AM

## 2023-01-21 NOTE — Progress Notes (Signed)
[facility] Cardiology  SUBJECTIVE: Patient laying in bed, has chest pain, reports improved breathing, complains of left ankle and foot pain   Vitals:   01/20/23 2304 01/21/23 0416 01/21/23 0458 01/21/23 0734  BP: 97/62 105/78 (!) 121/92 103/60  Pulse: 95 (!) 47 (!) 45 66  Resp: 20 20 18 16   Temp: 97.7 F (36.5 C) (!) 97.4 F (36.3 C)  98.2 F (36.8 C)  TempSrc:      SpO2: 97% 100% 97% 100%  Weight:      Height:         Intake/Output Summary (Last 24 hours) at 01/21/2023 7829 Last data filed at 01/21/2023 5621 Gross per 24 hour  Intake 695 ml  Output 280 ml  Net 415 ml      PHYSICAL EXAM  General: Well developed, well nourished, in no acute distress HEENT:  Normocephalic and atramatic Neck:  No JVD.  Lungs: Clear bilaterally to auscultation and percussion. Heart: HRRR . Normal S1 and S2 without gallops or murmurs.  Abdomen: Bowel sounds are positive, abdomen soft and non-tender  Msk:  Back normal, normal gait. Normal strength and tone for age. Extremities: No clubbing, cyanosis or edema.   Neuro: Alert and oriented X 3. Psych:  Good affect, responds appropriately   LABS: Basic Metabolic Panel: Recent Labs    01/20/23 0503 01/21/23 0522  NA 125* 122*  K 4.7 4.9  CL 87* 86*  CO2 26 26  GLUCOSE 108* 102*  BUN 32* 30*  CREATININE 0.84 1.07*  CALCIUM 8.5* 8.0*   Liver Function Tests: No results for input(s): "AST", "ALT", "ALKPHOS", "BILITOT", "PROT", "ALBUMIN" in the last 72 hours. No results for input(s): "LIPASE", "AMYLASE" in the last 72 hours. CBC: No results for input(s): "WBC", "NEUTROABS", "HGB", "HCT", "MCV", "PLT" in the last 72 hours. Cardiac Enzymes: No results for input(s): "CKTOTAL", "CKMB", "CKMBINDEX", "TROPONINI" in the last 72 hours. BNP: Invalid input(s): "POCBNP" D-Dimer: No results for input(s): "DDIMER" in the last 72 hours. Hemoglobin A1C: No results for input(s): "HGBA1C" in the last 72 hours. Fasting Lipid Panel: No results for  input(s): "CHOL", "HDL", "LDLCALC", "TRIG", "CHOLHDL", "LDLDIRECT" in the last 72 hours. Thyroid Function Tests: No results for input(s): "TSH", "T4TOTAL", "T3FREE", "THYROIDAB" in the last 72 hours.  Invalid input(s): "FREET3" Anemia Panel: No results for input(s): "VITAMINB12", "FOLATE", "FERRITIN", "TIBC", "IRON", "RETICCTPCT" in the last 72 hours.  No results found.   Echo LVEF 25-30% 01/14/2023  TELEMETRY: Atrial fibrillation 89 bpm:  ASSESSMENT AND PLAN:  Principal Problem:   Acute on chronic systolic CHF (congestive heart failure) (HCC) Active Problems:   Rheumatoid arthritis involving multiple sites [facility])   Personal history of immunosuppressive therapy   Elevated troponin   Wheelchair dependence   Hyponatremia   Paroxysmal atrial fibrillation (HCC)   Toe ulcer, left, with fat layer exposed (HCC)   GERD (gastroesophageal reflux disease)   Insomnia    1.  Acute on chronic HFrEF, LVEF 25-30% 01/14/2023, right and left heart cardiac catheterization/19/2024 revealed normal coronary anatomy, left ventricular function 25-30%, with normal right-sided pressures, currently appears euvolemic 2.  Atrial fibrillation with RVR, new onset, on Eliquis for stroke prevention, amiodarone and metoprolol extended for rate and rhythm control, currently in atrial fibrillation, rate 79 bpm, asymptomatic 3.  Rheumatoid arthritis, planes of left ankle and foot pain  Recommendations  1.  Agree with current plan 2.  Continue Eliquis for stroke prevention 3.  Continue amiodarone load, 400 mg twice daily, and to taper to 200  mg twice daily 4.  Consider possible discharge in a.m. 5.  Follow-up Dr. Melton Alar 1 week, probable TEE guided cardioversion to outpatient   Marcina Millard, MD, PhD, [facility] 01/21/2023 9:38 AM

## 2023-01-21 NOTE — Assessment & Plan Note (Signed)
Poor appetite for last few days.  Change diet to regular diet.  Creatinine went up to 1.07.  Hold Lasix.  Trying to hold on IV fluids secondary to hyponatremia

## 2023-01-21 NOTE — Progress Notes (Signed)
Progress Note   Patient: Lindsay Arnold ZOX:[id number] DOB: 11-13-1940 DOA: 01/13/2023     8 DOS: the patient was seen and examined on 01/21/2023   Brief Arnold course: Ms. Lindsay Arnold is a 82 year old female with history of rheumatoid arthritis involving multiple sites, on long-term use of immunosuppression medication, chronic bedbound and wheelchair bound state due to severe RA, history of drug-induced leukopenia, B12 deficiency, hypothyroid, who presents to the emergency department from outpatient cardiology clinic for chief concerns of weakness and shortness of breath on exertion. Lab results showed BNP 3311, troponin 35.  Patient is seen by cardiology, started on IV Lasix for exacerbation congestive heart failure. Echocardiogram showed ejection fraction 25 to 30%, grade 2 diastolic dysfunction.  Moderate mitral valve regurgitation. Heart cath performed on 11/19, showed near normal coronary arteries.  11/20.  Cardiology making adjustments to medications with switching flecainide over to amiodarone and adding metoprolol.  Patient points out a spot on her toe that did have some bleeding last week. 11/21.  Cardiology holding Lasix today and will reevaluate tomorrow and likely will start back on oral Lasix tomorrow.  Seen by podiatry and dressing changes ordered. 11/22.  Patient not sleeping very well at night.  Feels tired.  Sodium down to 135.  Does not feel hungry.  Placed back on her PPI. 11/23.  Patient complaining of left foot and ankle pain.  History of rheumatoid arthritis.  Patient's sodium also dropped down to 122.  Dose of tolvaptan ordered.  Regular diet ordered.  Assessment and Plan: * Acute on chronic systolic CHF (congestive heart failure) (HCC) Holding Lasix today.  Patient on Toprol-XL 50 mg twice a day.  Blood pressure limiting other medications currently.   Hyponatremia Sodium 122.  Will give a dose of tolvaptan today and check serial sodiums.  Change diet to  regular.  AKI (acute kidney injury) (HCC) Poor appetite for last few days.  Change diet to regular diet.  Creatinine went up to 1.07.  Hold Lasix.  Trying to hold on IV fluids secondary to hyponatremia  Paroxysmal atrial fibrillation (HCC) Continue amiodarone.  Patient also on Toprol-XL 50 mg twice a day.  Patient on Eliquis twice daily.  Rheumatoid arthritis involving multiple sites Lindsay Arnold) Patient gets subcutaneous injections outpatient every 28 days.  Patient on chronic prednisone.  With left ankle pain and will stop prednisone today and give Solu-Medrol  Elevated troponin Borderline elevated troponin.  Cardiac catheterization negative.  Elevated troponin likely demand ischemia from heart failure  Wheelchair dependence Patient able to pivot on her feet but unable to walk.  Insomnia As needed trazodone  GERD (gastroesophageal reflux disease) Placed back on PPI.  Will do twice a day for right now.  Toe ulcer, left, with fat layer exposed [facility]) Appreciate podiatry consultation.  Patient has a toe ulcer with her toe deformities.  Fourth and fifth toes with Betadine and a small strip of calcium alginate with gauze.  Cover with dry gauze and wrap daily.        Subjective: Patient slept with trazodone last night.  Still trying to wake up when I saw her this morning.  Complains of left ankle pain and foot pain.  Patient has a history of rheumatoid arthritis.  Patient's stomach feeling better after being on PPI.  Initially admitted with weakness and shortness of breath with exertion and found to have CHF exacerbation.  Physical Exam: Vitals:   01/21/23 0458 01/21/23 0734 01/21/23 1111 01/21/23 1114  BP: (!) 121/92 103/60 (!) 86/35 Marland Kitchen)  97/52  Pulse: (!) 45 66 96 77  Resp: 18 16 18    Temp:  98.2 F (36.8 C) 98.2 F (36.8 C)   TempSrc:      SpO2: 97% 100% 100%   Weight:      Height:       Physical Exam HENT:     Head: Normocephalic.     Mouth/Throat:     Pharynx: No  oropharyngeal exudate.  Eyes:     General: Lids are normal.     Conjunctiva/sclera: Conjunctivae normal.  Cardiovascular:     Rate and Rhythm: Normal rate and regular rhythm.     Heart sounds: Normal heart sounds, S1 normal and S2 normal.  Pulmonary:     Breath sounds: No decreased breath sounds, wheezing, rhonchi or rales.  Abdominal:     Palpations: Abdomen is soft.     Tenderness: There is no abdominal tenderness.  Musculoskeletal:     Right lower leg: No swelling.     Left lower leg: No swelling.     Left ankle: Tenderness present. Decreased range of motion.     Comments: Hand and foot and toe deformities secondary to rheumatoid arthritis.  Skin:    General: Skin is warm.     Comments: Foot covered.  Neurological:     Mental Status: She is alert and oriented to person, place, and time.     Data Reviewed: Sodium 122, creatinine 1.07  Family Communication: Spoke with family at bedside  Disposition: Status is: Inpatient Remains inpatient appropriate because: Treating sodium with tolvaptan today.  Giving IV Solu-Medrol for left ankle pain.  Planned Discharge Destination: Rehab    Time spent: 29 minutes Case discussed with cardiology  Author: Alford Highland, MD 01/21/2023 12:23 PM  For on call review [email or web address].

## 2023-01-21 NOTE — Plan of Care (Signed)
Problem: Clinical Measurements: Goal: Ability to maintain clinical measurements within normal limits will improve Outcome: Progressing Goal: Respiratory complications will improve Outcome: Progressing Goal: Cardiovascular complication will be avoided Outcome: Progressing

## 2023-01-22 DIAGNOSIS — I480 Paroxysmal atrial fibrillation: Secondary | ICD-10-CM | POA: Diagnosis not present

## 2023-01-22 DIAGNOSIS — E871 Hypo-osmolality and hyponatremia: Secondary | ICD-10-CM | POA: Diagnosis not present

## 2023-01-22 DIAGNOSIS — N179 Acute kidney failure, unspecified: Secondary | ICD-10-CM | POA: Diagnosis not present

## 2023-01-22 DIAGNOSIS — I5023 Acute on chronic systolic (congestive) heart failure: Secondary | ICD-10-CM | POA: Diagnosis not present

## 2023-01-22 LAB — BASIC METABOLIC PANEL
Anion gap: 11 (ref 5–15)
BUN: 32 mg/dL — ABNORMAL HIGH (ref 8–23)
CO2: 25 mmol/L (ref 22–32)
Calcium: 8.2 mg/dL — ABNORMAL LOW (ref 8.9–10.3)
Chloride: 90 mmol/L — ABNORMAL LOW (ref 98–111)
Creatinine, Ser: 0.99 mg/dL (ref 0.44–1.00)
GFR, Estimated: 57 mL/min — ABNORMAL LOW (ref >60)
Glucose, Bld: 145 mg/dL — ABNORMAL HIGH (ref 70–99)
Potassium: 4.4 mmol/L (ref 3.5–5.1)
Sodium: 126 mmol/L — ABNORMAL LOW (ref 135–145)

## 2023-01-22 LAB — CBC
HCT: 39.7 % (ref 36.0–46.0)
Hemoglobin: 12.5 g/dL (ref 12.0–15.0)
MCH: 24.7 pg — ABNORMAL LOW (ref 26.0–34.0)
MCHC: 31.5 g/dL (ref 30.0–36.0)
MCV: 78.3 fL — ABNORMAL LOW (ref 80.0–100.0)
Platelets: 286 10*3/uL (ref 150–400)
RBC: 5.07 MIL/uL (ref 3.87–5.11)
RDW: 19 % — ABNORMAL HIGH (ref 11.5–15.5)
WBC: 13.9 10*3/uL — ABNORMAL HIGH (ref 4.0–10.5)
nRBC: 0 % (ref 0.0–0.2)

## 2023-01-22 MED ORDER — SODIUM CHLORIDE 0.9% FLUSH
3.0000 mL | Freq: Two times a day (BID) | INTRAVENOUS | Status: DC
  Administered 2023-01-22 – 2023-01-23 (×2): 3 mL via INTRAVENOUS

## 2023-01-22 MED ORDER — SODIUM CHLORIDE 1 G PO TABS
1.0000 g | ORAL_TABLET | Freq: Two times a day (BID) | ORAL | Status: DC
  Administered 2023-01-22 – 2023-01-23 (×3): 1 g via ORAL
  Filled 2023-01-22 (×3): qty 1

## 2023-01-22 NOTE — Plan of Care (Signed)
  Problem: Education: Goal: Knowledge of General Education information will improve Description: Including pain rating scale, medication(s)/side effects and non-pharmacologic comfort measures Outcome: Progressing   Problem: Health Behavior/Discharge Planning: Goal: Ability to manage health-related needs will improve Outcome: Progressing   Problem: Clinical Measurements: Goal: Ability to maintain clinical measurements within normal limits will improve Outcome: Progressing Goal: Will remain free from infection Outcome: Progressing Goal: Diagnostic test results will improve Outcome: Progressing Goal: Respiratory complications will improve Outcome: Progressing Goal: Cardiovascular complication will be avoided Outcome: Progressing   Problem: Activity: Goal: Risk for activity intolerance will decrease Outcome: Progressing   Problem: Nutrition: Goal: Adequate nutrition will be maintained Outcome: Progressing   Problem: Coping: Goal: Level of anxiety will decrease Outcome: Progressing   Problem: Elimination: Goal: Will not experience complications related to bowel motility Outcome: Progressing Goal: Will not experience complications related to urinary retention Outcome: Progressing   Problem: Pain Management: Goal: General experience of comfort will improve Outcome: Progressing   Problem: Safety: Goal: Ability to remain free from injury will improve Outcome: Progressing   Problem: Skin Integrity: Goal: Risk for impaired skin integrity will decrease Outcome: Progressing   Problem: Education: Goal: Understanding of CV disease, CV risk reduction, and recovery process will improve Outcome: Progressing Goal: Individualized Educational Video(s) Outcome: Progressing   Problem: Activity: Goal: Ability to return to baseline activity level will improve Outcome: Progressing   Problem: Cardiovascular: Goal: Ability to achieve and maintain adequate cardiovascular perfusion  will improve Outcome: Progressing Goal: Vascular access site(s) Level 0-1 will be maintained Outcome: Progressing   Problem: Health Behavior/Discharge Planning: Goal: Ability to safely manage health-related needs after discharge will improve Outcome: Progressing   Problem: Education: Goal: Ability to demonstrate management of disease process will improve Outcome: Progressing Goal: Ability to verbalize understanding of medication therapies will improve Outcome: Progressing Goal: Individualized Educational Video(s) Outcome: Progressing   Problem: Activity: Goal: Capacity to carry out activities will improve Outcome: Progressing   Problem: Cardiac: Goal: Ability to achieve and maintain adequate cardiopulmonary perfusion will improve Outcome: Progressing

## 2023-01-22 NOTE — Progress Notes (Signed)
[facility] Cardiology    SUBJECTIVE: Patient states she feels much better in bed improved shortness of breath no chest pain much improved since her admission resting comfortably in bed   Vitals:   01/22/23 0000 01/22/23 0452 01/22/23 0736 01/22/23 1107  BP: (!) 109/55 113/68 (!) 92/55 95/61  Pulse: 82 97 92 85  Resp: 20 20 18 18   Temp: 98.1 F (36.7 C) 97.9 F (36.6 C) 97.7 F (36.5 C) 97.8 F (36.6 C)  TempSrc: Oral  Oral Oral  SpO2: 98% 100% 99% 99%  Weight:      Height:         Intake/Output Summary (Last 24 hours) at 01/22/2023 1415 Last data filed at 01/22/2023 0400 Gross per 24 hour  Intake --  Output 1250 ml  Net -1250 ml      PHYSICAL EXAM  General: Well developed, well nourished, in no acute distress HEENT:  Normocephalic and atramatic Neck:  No JVD.  Lungs: Clear bilaterally to auscultation and percussion. Heart: Irregular irregular. Normal S1 and S2 without gallops or murmurs.  Abdomen: Bowel sounds are positive, abdomen soft and non-tender  Msk:  Back normal, normal gait. Normal strength and tone for age. Extremities: No clubbing, cyanosis or edema.  Severe deformities of extremities Neuro: Alert and oriented X 3. Psych:  Good affect, responds appropriately   LABS: Basic Metabolic Panel: Recent Labs    01/21/23 1310 01/21/23 2043 01/22/23 0514  NA 125* 123* 126*  K 4.5  --  4.4  CL 87*  --  90*  CO2 24  --  25  GLUCOSE 140*  --  145*  BUN 31*  --  32*  CREATININE 0.91  --  0.99  CALCIUM 8.3*  --  8.2*   Liver Function Tests: Recent Labs    01/21/23 1310  AST 24  ALT 10  ALKPHOS 85  BILITOT 1.3*  PROT 6.2*  ALBUMIN 3.1*   No results for input(s): "LIPASE", "AMYLASE" in the last 72 hours. CBC: Recent Labs    01/22/23 0514  WBC 13.9*  HGB 12.5  HCT 39.7  MCV 78.3*  PLT 286   Cardiac Enzymes: No results for input(s): "CKTOTAL", "CKMB", "CKMBINDEX", "TROPONINI" in the last 72 hours. BNP: Invalid input(s): "POCBNP" D-Dimer: No  results for input(s): "DDIMER" in the last 72 hours. Hemoglobin A1C: No results for input(s): "HGBA1C" in the last 72 hours. Fasting Lipid Panel: No results for input(s): "CHOL", "HDL", "LDLCALC", "TRIG", "CHOLHDL", "LDLDIRECT" in the last 72 hours. Thyroid Function Tests: No results for input(s): "TSH", "T4TOTAL", "T3FREE", "THYROIDAB" in the last 72 hours.  Invalid input(s): "FREET3" Anemia Panel: No results for input(s): "VITAMINB12", "FOLATE", "FERRITIN", "TIBC", "IRON", "RETICCTPCT" in the last 72 hours.  No results found.   Echo severely depressed left ventricular function EF of 25 to 30% diastolic dysfunction LVH  TELEMETRY: Atrial fibrillation rate of 98 nonspecific ST-T changes:  ASSESSMENT AND PLAN:  Principal Problem:   Acute on chronic systolic CHF (congestive heart failure) (HCC) Active Problems:   Rheumatoid arthritis involving multiple sites [facility])   Personal history of immunosuppressive therapy   Elevated troponin   Wheelchair dependence   Hyponatremia   Paroxysmal atrial fibrillation (HCC)   Toe ulcer, left, with fat layer exposed (HCC)   GERD (gastroesophageal reflux disease)   Insomnia   AKI (acute kidney injury) (HCC)    Plan Acute on chronic congestive heart failure continue current therapy with diuretics beta-blocker recommend adding spironolactone Atrial fibrillation RVR agree with amiodarone for  rhythm metoprolol for rate and Eliquis for anticoagulation agree with discontinuing flecainide with a cardiomyopathy Rheumatoid arthritis severe followed by rheumatology nonambulatory continue current management Consider sleep study for obstructive sleep apnea Continue to manage acute renal insufficiency which is improving slowly Correct electrolytes Agree with PPI therapy for GERD Chronic deformities of joints and feet continue current therapy as per podiatry Recommend conservative cardiac input at this point Refer the patient to heart failure clinic upon  discharge Consider AICD placement  Alwyn Pea, MD 01/22/2023 2:15 PM

## 2023-01-22 NOTE — Plan of Care (Signed)
  Problem: Education: Goal: Knowledge of General Education information will improve Description: Including pain rating scale, medication(s)/side effects and non-pharmacologic comfort measures Outcome: Progressing   Problem: Clinical Measurements: Goal: Ability to maintain clinical measurements within normal limits will improve Outcome: Progressing Goal: Will remain free from infection Outcome: Progressing   Problem: Nutrition: Goal: Adequate nutrition will be maintained Outcome: Progressing   Problem: Elimination: Goal: Will not experience complications related to bowel motility Outcome: Progressing

## 2023-01-22 NOTE — Progress Notes (Signed)
Progress Note   Patient: LYN KOMM FAO:[id number] DOB: 10/22/1940 DOA: 01/13/2023     9 DOS: the patient was seen and examined on 01/22/2023   Brief hospital course: Ms. Anyce Winston is a 82 year old female with history of rheumatoid arthritis involving multiple sites, on long-term use of immunosuppression medication, chronic bedbound and wheelchair bound state due to severe RA, history of drug-induced leukopenia, B12 deficiency, hypothyroid, who presents to the emergency department from outpatient cardiology clinic for chief concerns of weakness and shortness of breath on exertion. Lab results showed BNP 3311, troponin 35.  Patient is seen by cardiology, started on IV Lasix for exacerbation congestive heart failure. Echocardiogram showed ejection fraction 25 to 30%, grade 2 diastolic dysfunction.  Moderate mitral valve regurgitation. Heart cath performed on 11/19, showed near normal coronary arteries.  11/20.  Cardiology making adjustments to medications with switching flecainide over to amiodarone and adding metoprolol.  Patient points out a spot on her toe that did have some bleeding last week. 11/21.  Cardiology holding Lasix today and will reevaluate tomorrow and likely will start back on oral Lasix tomorrow.  Seen by podiatry and dressing changes ordered. 11/22.  Patient not sleeping very well at night.  Feels tired.  Sodium down to 135.  Does not feel hungry.  Placed back on her PPI. 11/23.  Patient complaining of left foot and ankle pain.  History of rheumatoid arthritis.  Patient's sodium also dropped down to 122.  Dose of tolvaptan ordered.  Regular diet ordered. 11/24.  Patient's sodium 126.  Start salt tablets.  Hold Lasix today.  Assessment and Plan: * Acute on chronic systolic CHF (congestive heart failure) (HCC) Holding Lasix today.  Patient on Toprol-XL 50 mg twice a day.  Blood pressure limiting other medications currently.   Hyponatremia Sodium 126.  Tolvaptan dose  given on 11/23.  Start salt tablets today.  Regular diet.  Fluid restrict.  AKI (acute kidney injury) (HCC) Poor appetite for last few days.  Change diet to regular diet.  Creatinine went up to 1.07.  Hold Lasix.  Creatinine down to 0.99.  Paroxysmal atrial fibrillation (HCC) Continue amiodarone.  Patient also on Toprol-XL 50 mg twice a day.  Patient on Eliquis twice daily.  Rheumatoid arthritis involving multiple sites [facility]) Patient gets subcutaneous injections outpatient every 28 days.  Patient on chronic prednisone.  With left ankle pain, continue Solu-Medrol  Elevated troponin Borderline elevated troponin.  Cardiac catheterization negative.  Elevated troponin likely demand ischemia from heart failure  Wheelchair dependence Patient able to pivot on her feet but unable to walk.  Insomnia As needed trazodone  GERD (gastroesophageal reflux disease) Placed back on PPI.  Will do twice a day for right now.  Toe ulcer, left, with fat layer exposed [facility]) Appreciate podiatry consultation.  Patient has a toe ulcer with her toe deformities.  Fourth and fifth toes with Betadine and a small strip of calcium alginate with gauze.  Cover with dry gauze and wrap daily.        Subjective: Patient feels better with regards to her joints.  Continue Solu-Medrol today.  Initially admitted with heart failure exacerbation.  Given a dose of tolvaptan yesterday for low sodium.  Physical Exam: Vitals:   01/22/23 0000 01/22/23 0452 01/22/23 0736 01/22/23 1107  BP: (!) 109/55 113/68 (!) 92/55 95/61  Pulse: 82 97 92 85  Resp: 20 20 18 18   Temp: 98.1 F (36.7 C) 97.9 F (36.6 C) 97.7 F (36.5 C) 97.8 F (36.6  C)  TempSrc: Oral  Oral Oral  SpO2: 98% 100% 99% 99%  Weight:      Height:       Physical Exam HENT:     Head: Normocephalic.     Mouth/Throat:     Pharynx: No oropharyngeal exudate.  Eyes:     General: Lids are normal.     Conjunctiva/sclera: Conjunctivae normal.  Cardiovascular:      Rate and Rhythm: Normal rate and regular rhythm.     Heart sounds: Normal heart sounds, S1 normal and S2 normal.  Pulmonary:     Breath sounds: No decreased breath sounds, wheezing, rhonchi or rales.  Abdominal:     Palpations: Abdomen is soft.     Tenderness: There is no abdominal tenderness.  Musculoskeletal:     Right lower leg: No swelling.     Left lower leg: No swelling.     Left ankle: Tenderness present. Decreased range of motion.     Comments: Hand and foot and toe deformities secondary to rheumatoid arthritis.  Skin:    General: Skin is warm.     Comments: Looks like some bruising medial left ankle.  Nodule right foot.  Neurological:     Mental Status: She is alert and oriented to person, place, and time.     Data Reviewed: Sodium 126, creatinine 0.99, white blood count 13.9, hemoglobin 12.5, platelet count 286  Family Communication: Spoke with son at the bedside  Disposition: Status is: Inpatient Remains inpatient appropriate because: Likely rehab tomorrow if sodium comes up a little bit.  Planned Discharge Destination: Rehab    Time spent: 28 minutes  Author: Alford Highland, MD 01/22/2023 1:06 PM  For on call review [email or web address].

## 2023-01-23 DIAGNOSIS — I480 Paroxysmal atrial fibrillation: Secondary | ICD-10-CM | POA: Diagnosis not present

## 2023-01-23 DIAGNOSIS — I5023 Acute on chronic systolic (congestive) heart failure: Secondary | ICD-10-CM | POA: Diagnosis not present

## 2023-01-23 DIAGNOSIS — N179 Acute kidney failure, unspecified: Secondary | ICD-10-CM | POA: Diagnosis not present

## 2023-01-23 DIAGNOSIS — E871 Hypo-osmolality and hyponatremia: Secondary | ICD-10-CM | POA: Diagnosis not present

## 2023-01-23 LAB — BASIC METABOLIC PANEL
Anion gap: 10 (ref 5–15)
BUN: 36 mg/dL — ABNORMAL HIGH (ref 8–23)
CO2: 26 mmol/L (ref 22–32)
Calcium: 8.1 mg/dL — ABNORMAL LOW (ref 8.9–10.3)
Chloride: 92 mmol/L — ABNORMAL LOW (ref 98–111)
Creatinine, Ser: 1.03 mg/dL — ABNORMAL HIGH (ref 0.44–1.00)
GFR, Estimated: 55 mL/min — ABNORMAL LOW (ref >60)
Glucose, Bld: 139 mg/dL — ABNORMAL HIGH (ref 70–99)
Potassium: 4.3 mmol/L (ref 3.5–5.1)
Sodium: 128 mmol/L — ABNORMAL LOW (ref 135–145)

## 2023-01-23 SURGERY — RIGHT/LEFT HEART CATH AND CORONARY ANGIOGRAPHY
Anesthesia: Moderate Sedation

## 2023-01-23 MED ORDER — AMIODARONE HCL 200 MG PO TABS: ORAL_TABLET | ORAL | 0 refills | Status: AC

## 2023-01-23 MED ORDER — SODIUM CHLORIDE 1 G PO TABS: 1.0000 g | ORAL_TABLET | Freq: Every day | ORAL | 0 refills | Status: AC

## 2023-01-23 MED ORDER — ACETAMINOPHEN 500 MG PO TABS: 1000.0000 mg | ORAL_TABLET | Freq: Four times a day (QID) | ORAL | Status: AC | PRN

## 2023-01-23 MED ORDER — PREDNISONE 5 MG PO TABS: ORAL_TABLET | ORAL | 0 refills | Status: AC

## 2023-01-23 MED ORDER — FUROSEMIDE 20 MG PO TABS: 20.0000 mg | ORAL_TABLET | Freq: Every day | ORAL | Status: DC

## 2023-01-23 MED ORDER — METOPROLOL SUCCINATE ER 50 MG PO TB24: 50.0000 mg | ORAL_TABLET | Freq: Two times a day (BID) | ORAL | 0 refills | Status: AC

## 2023-01-23 MED ORDER — TRAZODONE HCL 50 MG PO TABS: 25.0000 mg | ORAL_TABLET | Freq: Every evening | ORAL | 0 refills | Status: AC | PRN

## 2023-01-23 MED ORDER — APIXABAN 5 MG PO TABS: 5.0000 mg | ORAL_TABLET | Freq: Two times a day (BID) | ORAL | 0 refills | Status: AC

## 2023-01-23 NOTE — Care Management Important Message (Signed)
Important Message  Patient Details  Name: Lindsay Arnold MRN: [id number] Date of Birth: 1940-05-12   Important Message Given:  Yes - Medicare IM     Olegario Messier A Mckinlee Dunk 01/23/2023, 9:28 AM

## 2023-01-23 NOTE — TOC Transition Note (Signed)
Transition of Care [facility]) - CM/SW Discharge Note   Patient Details  Name: Lindsay Arnold MRN: [id number] Date of Birth: 11-22-1940  [facility] CM/SW Contact:  Darolyn Rua, LCSW Phone Number: 01/23/2023, 9:37 AM   Clinical Narrative:     Patient will DC to: [facility] Anticipated DC date: 01/23/23 Family notified: spouse and son Transport by: Wendie Simmer  Per MD patient ready for DC to [facility]. RN, patient, patient's family, and facility notified of DC. Discharge Summary sent to facility. RN given number for report   [phone] Room 602. DC packet on chart. Ambulance transport requested for patient.  CSW signing off.    Final next level of care: Skilled Nursing Facility Barriers to Discharge: No Barriers Identified   Patient Goals and CMS Choice CMS Medicare.gov Compare Post Acute Care list provided to:: Patient Choice offered to / list presented to : Patient  Discharge Placement                         Discharge Plan and Services Additional resources added to the After Visit Summary for                                       Social Determinants of Health (SDOH) Interventions SDOH Screenings   Food Insecurity: No Food Insecurity (01/13/2023)  Housing: Low Risk  (01/18/2023)  Transportation Needs: No Transportation Needs (01/18/2023)  Utilities: Not At Risk (01/13/2023)  Financial Resource Strain: Low Risk  (01/18/2023)  Tobacco Use: Low Risk  (01/13/2023)     Readmission Risk Interventions     No data to display

## 2023-01-23 NOTE — Plan of Care (Signed)
Problem: Education: Goal: Knowledge of General Education information will improve Description: Including pain rating scale, medication(s)/side effects and non-pharmacologic comfort measures Outcome: Progressing   Problem: Clinical Measurements: Goal: Will remain free from infection Outcome: Progressing Goal: Diagnostic test results will improve Outcome: Progressing Goal: Respiratory complications will improve Outcome: Progressing Goal: Cardiovascular complication will be avoided Outcome: Progressing

## 2023-01-23 NOTE — Progress Notes (Signed)
[facility] C[facility] PROGRESS NOTE   Patient ID: Lindsay Arnold MRN: [id number] DOB/AGE: 1940/07/08 82 y.o.  Admit date: 01/13/2023 Referring Physician Dr. Londell Moh Primary Physician Lynnea Ferrier, MD  Primary Cardiologist Dr. Melton Alar  Reason for Consultation shortness of breath, acute heart failure  HPI: Lindsay [facility] is a 82 y.o. female with a past medical history of severe rheumatoid arthritis, hypothyroidism who presented to the ED on 01/13/2023 for weakness, shortness of breath, abnormal lab work. Cardiology was consulted for further evaluation.   Interval History:  -Patient reports she feels great this morning.  States she got IV steroids over the weekend and this significantly improved how she felt. -Denies any chest pain, shortness of breath, palpitations.  Heart rate much better controlled, remains in atrial fibrillation.  BP borderline, stable. -Creatinine slightly up this a.m., Lasix has been held.  Sodium improved on a.m. labs.  Review of systems complete and found to be negative unless listed above    Vitals:   01/22/23 2011 01/22/23 2351 01/23/23 0507 01/23/23 0831  BP: 98/68 109/69 106/62 (!) 111/52  Pulse: 100 76 78 79  Resp: 20 20 20 18   Temp: (!) 97.5 F (36.4 C) 97.6 F (36.4 C) 97.9 F (36.6 C) (!) 97.4 F (36.3 C)  TempSrc: Oral Oral  Oral  SpO2: 95% 98% 100% 98%  Weight:      Height:         Intake/Output Summary (Last 24 hours) at 01/23/2023 0865 Last data filed at 01/23/2023 0500 Gross per 24 hour  Intake --  Output 1250 ml  Net -1250 ml     PHYSICAL EXAM  General: Chronically ill-appearing female, well nourished, in no acute distress laying at an incline in hospital bed with family present at bedside. HEENT: Normocephalic and atraumatic. Neck: No JVD.  Lungs: Normal respiratory effort on room air. Clear bilaterally to auscultation. No wheezes, crackles, rhonchi.  Heart: Irregularly irregular. Normal S1 and S2 without gallops or  murmurs. Radial & DP pulses 2+ bilaterally. Abdomen: Non-distended appearing.  Msk: Normal strength and tone for age. Extremities: Mild peripheral edema. Neuro: Alert and oriented X 3. Psych: Mood appropriate, affect congruent.    LABS: Basic Metabolic Panel: Recent Labs    01/22/23 0514 01/23/23 0432  NA 126* 128*  K 4.4 4.3  CL 90* 92*  CO2 25 26  GLUCOSE 145* 139*  BUN 32* 36*  CREATININE 0.99 1.03*  CALCIUM 8.2* 8.1*   Liver Function Tests: Recent Labs    01/21/23 1310  AST 24  ALT 10  ALKPHOS 85  BILITOT 1.3*  PROT 6.2*  ALBUMIN 3.1*   No results for input(s): "LIPASE", "AMYLASE" in the last 72 hours. CBC: Recent Labs    01/22/23 0514  WBC 13.9*  HGB 12.5  HCT 39.7  MCV 78.3*  PLT 286   Cardiac Enzymes: No results for input(s): "CKTOTAL", "CKMB", "CKMBINDEX", "TROPONINIHS" in the last 72 hours.  BNP: No results for input(s): "BNP" in the last 72 hours. D-Dimer: No results for input(s): "DDIMER" in the last 72 hours. Hemoglobin A1C: No results for input(s): "HGBA1C" in the last 72 hours. Fasting Lipid Panel: No results for input(s): "CHOL", "HDL", "LDLCALC", "TRIG", "CHOLHDL", "LDLDIRECT" in the last 72 hours. Thyroid Function Tests: No results for input(s): "TSH", "T4TOTAL", "T3FREE", "THYROIDAB" in the last 72 hours.  Invalid input(s): "FREET3" Anemia Panel: No results for input(s): "VITAMINB12", "FOLATE", "FERRITIN", "TIBC", "IRON", "RETICCTPCT" in the last 72 hours.  No results found.  ECHO 01/14/23: 1. Left ventricular ejection fraction, by estimation, is 25 to 30%. The left ventricle has severely decreased function. The left ventricle demonstrates global hypokinesis. The left ventricular internal cavity size was severely dilated. There is mild left ventricular hypertrophy. Left ventricular diastolic parameters are consistent with Grade II diastolic dysfunction (pseudonormalization).   2. Right ventricular systolic function is moderately  reduced. The right ventricular size is moderately enlarged. Mildly increased right ventricular wall thickness.   3. Left atrial size was severely dilated.   4. Right atrial size was severely dilated.   5. The mitral valve is normal in structure. Moderate mitral valve regurgitation. No evidence of mitral stenosis.   6. Tricuspid valve regurgitation is moderate.   7. The aortic valve is normal in structure. Aortic valve regurgitation is moderate. No aortic stenosis is present.   8. The inferior vena cava is normal in size with greater than 50%  respiratory variability, suggesting right atrial pressure of 3 mmHg.   R+LHC 01/17/23: 1.  Normal coronary anatomy 2.  Moderate to severely reduced LV function with estimated LVEF 25-30% 3.  Near normal right-sided heart pressures, with mildly elevated mean pulmonary arterial pressure consistent with mild pulmonary hypertension  TELEMETRY reviewed by me 01/23/23: Atrial fibrillation rate 70s  EKG reviewed by me 01/23/23: atrial fibrillation RVR PVCs rate 121 bpm  DATA reviewed by me 01/23/23: last 24h vitals tele labs imaging I/O, hospitalist progress note  Principal Problem:   Acute on chronic systolic CHF (congestive heart failure) (HCC) Active Problems:   Rheumatoid arthritis involving multiple sites [facility])   Personal history of immunosuppressive therapy   Elevated troponin   Wheelchair dependence   Hyponatremia   Paroxysmal atrial fibrillation (HCC)   Toe ulcer, left, with fat layer exposed (HCC)   GERD (gastroesophageal reflux disease)   Insomnia   AKI (acute kidney injury) (HCC)    ASSESSMENT AND PLAN: [facility] is a 82 y.o. female with a past medical history of severe rheumatoid arthritis, hypothyroidism who presented to the ED on 01/13/2023 for weakness, shortness of breath, abnormal lab work. Cardiology was consulted for further evaluation.   # Acute heart failure reduced EF (EF 25-30% this admission) # Non-ischemic  cardiomyopathy Patient with worsening SOB, mild orthopnea, LE edema for the last few weeks. BNP significantly elevated this admission at 4300. Trops 35 > 35. EF reduced at 25-30%, patient denies any hx of heart failure. R+LHC this admission with normal coronaries, nearly normal right-sided pressures. Suspect new cardiomyopathy 2/2 new onset AF RVR.  -Plan to restart p.o. Lasix 20 mg daily tomorrow. -Continue metoprolol succinate 50 mg twice daily. Further additions to GDMT limited at this time by borderline BP. Consider addition of spironolactone at follow up appointment pending BP and renal function.  -Strict I/Os and daily weights.    # Atrial fibrillation RVR # New onset atrial fibrillation Patient found to be in AF RVR on admission, denies any prior history. -Continue eliquis 5 mg twice daily for stroke risk reduction. -Continue metoprolol 50 mg bid for rate control.  -Continue amiodarone 400 mg twice daily, orders for taper placed. Will consider cardioversion outpatient after follow up appointment if she remains in atrial fibrillation.   # Rheumatoid arthritis Patient with longstanding hx of rheumatoid arthritis s/p multiple therapies now wheelchair bound.  -Management per primary. -Consider [facility] outpatient for additional evaluation as some of her prior therapies for rheumatoid arthritis can cause heart failure.   Ok for discharge today from a cardiac perspective.  Will arrange for follow up in clinic with Dr. Melton Alar on Wednesday and HF clinic Tuesday 12/3.   This patient's case was discussed and created with Dr. Juliann Pares and he is in agreement.  Signed:  Gale Journey, PA-C  01/23/2023, 9:51 AM [facility] Cardiology

## 2023-01-23 NOTE — Discharge Summary (Signed)
Physician Discharge Summary   Patient: Lindsay Arnold MRN: [id number] DOB: 1941-02-07  Admit date:     01/13/2023  Discharge date: 01/23/23  Discharge Physician: Alford Highland   PCP: Lynnea Ferrier, MD   Recommendations at discharge:   Follow up with team at rehab one day Follow up cardiology one week Check bmp with follow up appointment (or in ome week)  Discharge Diagnoses: Principal Problem:   Acute on chronic systolic CHF (congestive heart failure) (HCC) Active Problems:   Hyponatremia   AKI (acute kidney injury) (HCC)   Paroxysmal atrial fibrillation (HCC)   Rheumatoid arthritis involving multiple sites (HCC)   Elevated troponin   Wheelchair dependence   Personal history of immunosuppressive therapy   Toe ulcer, left, with fat layer exposed (HCC)   GERD (gastroesophageal reflux disease)   Insomnia    Hospital Course: Lindsay Arnold is a 82 year old female with history of rheumatoid arthritis involving multiple sites, on long-term use of immunosuppression medication, chronic bedbound and wheelchair bound state due to severe RA, history of drug-induced leukopenia, B12 deficiency, hypothyroid, who presents to the emergency department from outpatient cardiology clinic for chief concerns of weakness and shortness of breath on exertion. Lab results showed BNP 3311, troponin 35.  Patient is seen by cardiology, started on IV Lasix for exacerbation congestive heart failure. Echocardiogram showed ejection fraction 25 to 30%, grade 2 diastolic dysfunction.  Moderate mitral valve regurgitation. Heart cath performed on 11/19, showed near normal coronary arteries.  11/20.  Cardiology making adjustments to medications with switching flecainide over to amiodarone and adding metoprolol.  Patient points out a spot on her toe that did have some bleeding last week. 11/21.  Cardiology holding Lasix today and will reevaluate tomorrow and likely will start back on oral Lasix tomorrow.   Seen by podiatry and dressing changes ordered. 11/22.  Patient not sleeping very well at night.  Feels tired.  Sodium down to 135.  Does not feel hungry.  Placed back on her PPI. 11/23.  Patient complaining of left foot and ankle pain.  History of rheumatoid arthritis.  Patient's sodium also dropped down to 122.  Dose of tolvaptan ordered.  Regular diet ordered. 11/24.  Patient's sodium 126.  Start salt tablets.  Hold Lasix today. 11/25.  Patients sodium 128.  Patient will go to rehab today.  Assessment and Plan: * Acute on chronic systolic CHF (congestive heart failure) (HCC) Restart Lasix tomorrow.  Patient on Toprol-XL 50 mg twice a day.  Blood pressure limiting other medications currently.   Hyponatremia Sodium 128.  Tolvaptan dose given on 11/23.  Continue salt tablet once a day.  Discontinue once sodium above 130.  Regular diet.  Fluid restrict .  AKI (acute kidney injury) (HCC) Poor appetite for last few days.  Change diet to regular diet.  Creatinine went up to 1.07.  Hold Lasix.  Creatinine 1.03.  Paroxysmal atrial fibrillation (HCC) Continue amiodarone taper to once a day dosing.  Patient also on Toprol-XL 50 mg twice a day.  Patient on Eliquis twice daily.  Rheumatoid arthritis involving multiple sites [facility]) Patient gets subcutaneous injections outpatient every 28 days.  Patient on chronic prednisone.  With left ankle pain, continue Solu-Medrol today and switch to prednisone taper down to usual 7.5 mg dose tomorrow.  Elevated troponin Borderline elevated troponin.  Cardiac catheterization negative.  Elevated troponin likely demand ischemia from heart failure  Wheelchair dependence Patient able to pivot on her feet but unable to walk.  Insomnia  As needed trazodone  GERD (gastroesophageal reflux disease) Placed back on PPI.  Toe ulcer, left, with fat layer exposed [facility]) Appreciate podiatry consultation.  Patient has a toe ulcer with her toe deformities.  Fourth and  fifth toes with Betadine and a small strip of calcium alginate with gauze.  Cover with dry gauze and wrap daily.         Consultants: cardiology Procedures performed: none Disposition: Rehabilitation facility Diet recommendation:  Regular diet DISCHARGE MEDICATION: Allergies as of 01/23/2023       Reactions   Celebrex [celecoxib]    Penicillins    Abatacept Cough        Medication List     STOP taking these medications    aspirin 81 MG tablet   certolizumab pegol 200 MG/ML prefilled syringe Commonly known as: CIMZIA   clindamycin monohydrate powder   leflunomide 10 MG tablet Commonly known as: ARAVA   meloxicam 7.5 MG tablet Commonly known as: MOBIC   Orencia 125 MG/ML Sosy Generic drug: Abatacept       TAKE these medications    acetaminophen 500 MG tablet Commonly known as: TYLENOL Take 2 tablets (1,000 mg total) by mouth every 6 (six) hours as needed. What changed:  medication strength how much to take   amiodarone 200 MG tablet Commonly known as: PACERONE Take 2 tablets (400 mg total) by mouth 2 (two) times daily for 4 days, THEN 1 tablet (200 mg total) 2 (two) times daily for 10 days, THEN 1 tablet (200 mg total) daily for 16 days. Start taking on: January 23, 2023   apixaban 5 MG Tabs tablet Commonly known as: ELIQUIS Take 1 tablet (5 mg total) by mouth 2 (two) times daily.   ascorbic acid 500 MG tablet Commonly known as: VITAMIN C Take 500 mg by mouth daily.   azelastine 0.1 % nasal spray Commonly known as: ASTELIN Place into both nostrils 2 (two) times daily. Use in each nostril as directed   calcium-vitamin D 500-200 MG-UNIT tablet Commonly known as: OSCAL WITH D Take 1 tablet by mouth.   furosemide 20 MG tablet Commonly known as: LASIX Take 20 mg by mouth daily.   hydroxychloroquine 200 MG tablet Commonly known as: PLAQUENIL Take 200 mg by mouth daily.   hydroxypropyl methylcellulose / hypromellose 2.5 % ophthalmic  solution Commonly known as: ISOPTO TEARS / GONIOVISC 1 drop.   ipratropium-albuterol 0.5-2.5 (3) MG/3ML Soln Commonly known as: DUONEB Take 3 mLs by nebulization every 6 (six) hours as needed.   levothyroxine 50 MCG tablet Commonly known as: SYNTHROID Take 50 mcg by mouth daily before breakfast.   metoprolol succinate 50 MG 24 hr tablet Commonly known as: TOPROL-XL Take 1 tablet (50 mg total) by mouth 2 (two) times daily. Take with or immediately following a meal.   multivitamin-lutein Caps capsule Take 1 capsule by mouth daily.   omeprazole 10 MG capsule Commonly known as: PRILOSEC Take 10 mg by mouth daily.   predniSONE 5 MG tablet Commonly known as: DELTASONE 6 tabs po day 1; 5 tabs po day 2; 4 tabs po day 3; 3 tabs po day 4; 2 tabs po day 5,6; then 1.5 tabs po daily afterwards What changed:  how much to take how to take this when to take this additional instructions   sodium chloride 1 g tablet Take 1 tablet (1 g total) by mouth daily.   traZODone 50 MG tablet Commonly known as: DESYREL Take 0.5 tablets (25 mg total) by mouth  at bedtime as needed for sleep.        Follow-up Information     Custovic, Rozell Searing, DO. Go in 1 week(s).   Specialty: Cardiology Why: Appointment made for Monday 11/25 with Dr. Melton Alar, appointment with [facility] clinic on 12/3. Contact information: [address] [place] [address] [phone]         Gwyneth Revels, DPM Follow up in 2 week(s).   Specialty: Podiatry Contact information: [address] [place] [address] [phone]                Discharge Exam: Filed Weights   01/13/23 1103 01/17/23 0437 01/17/23 0801  Weight: 66.7 kg 68.1 kg 68.1 kg   Physical Exam HENT:     Head: Normocephalic.     Mouth/Throat:     Pharynx: No oropharyngeal exudate.  Eyes:     General: Lids are normal.     Conjunctiva/sclera: Conjunctivae normal.  Cardiovascular:     Rate and Rhythm: Normal rate and  regular rhythm.     Heart sounds: Normal heart sounds, S1 normal and S2 normal.  Pulmonary:     Breath sounds: No decreased breath sounds, wheezing, rhonchi or rales.  Abdominal:     Palpations: Abdomen is soft.     Tenderness: There is no abdominal tenderness.  Musculoskeletal:     Right lower leg: No swelling.     Left lower leg: No swelling.     Left ankle: Tenderness present. Decreased range of motion.     Comments: Hand and foot and toe deformities secondary to rheumatoid arthritis.  Skin:    General: Skin is warm.     Comments: Looks like some bruising medial left ankle.  Nodule right foot. Ulcer top of left foot at base of toe  Neurological:     Mental Status: She is alert and oriented to person, place, and time.      Condition at discharge: stable  The results of significant diagnostics from this hospitalization (including imaging, microbiology, ancillary and laboratory) are listed below for reference.   Imaging Studies: DG Foot 2 Views Left  Result Date: 01/18/2023 CLINICAL DATA:  Left foot pain for 1 week. EXAM: LEFT FOOT - 2 VIEW COMPARISON:  None Available. FINDINGS: Extensive degenerative changes are seen involving the midfoot and hindfoot as well as the talotibial joint. Status post surgical posterior fusion of first metatarsophalangeal joint. Significant flexion deformity is seen involving the other 4 toes. Diffuse osteopenia is noted. Deformity of the distal portions of the second, third and fourth metatarsals is noted suggesting possible psoriasis. IMPRESSION: Chronic findings as noted above. No definite acute abnormality is seen, although fracture osteomyelitis could be obscured due to the extensive deformity of this foot. Electronically Signed   By: Lupita Raider M.D.   On: 01/18/2023 19:12   CARDIAC CATHETERIZATION  Result Date: 01/17/2023   There is moderate to severe left ventricular systolic dysfunction.   LV end diastolic pressure is mildly elevated.   The  left ventricular ejection fraction is 25-35% by visual estimate.   Hemodynamic findings consistent with mild pulmonary hypertension. 1.  Normal coronary anatomy 2.  Moderate to severely reduced left ventricular function with estimated LVEF 25-30% 3.  Near normal right-sided heart pressures, with mildly elevated mean pulmonary arterial pressure consistent with mild pulmonary hypertension Recommendations 1.  Good medical management 2.  Possible referral to EP as outpatient to consider ICD for primary prevention   ECHOCARDIOGRAM COMPLETE  Result Date: 01/15/2023  ECHOCARDIOGRAM REPORT   Patient Name:   Lindsay Arnold Date of Exam: 01/14/2023 Medical Rec #:  [id number]       Height:       65.0 in Accession #:    [id number]      Weight:       147.0 lb Date of Birth:  [birth year]        BSA:          1.736 m Patient Age:    82 years        BP:           101/68 mmHg Patient Gender: F               HR:           115 bpm. Exam Location:  [facility] Procedure: 2D Echo, Cardiac Doppler and Color Doppler Indications:     Abnormal ECG R94.31  History:         Patient has no prior history of Echocardiogram examinations.                  CHF; Signs/Symptoms:Dyspnea.  Sonographer:     Lucendia Herrlich RCS Referring Phys:  [id number] AMY N COX Diagnosing Phys: Adrian Blackwater IMPRESSIONS  1. Left ventricular ejection fraction, by estimation, is 25 to 30%. The left ventricle has severely decreased function. The left ventricle demonstrates global hypokinesis. The left ventricular internal cavity size was severely dilated. There is mild left ventricular hypertrophy. Left ventricular diastolic parameters are consistent with Grade II diastolic dysfunction (pseudonormalization).  2. Right ventricular systolic function is moderately reduced. The right ventricular size is moderately enlarged. Mildly increased right ventricular wall thickness.  3. Left atrial size was severely dilated.  4. Right atrial size was severely dilated.  5. The mitral  valve is normal in structure. Moderate mitral valve regurgitation. No evidence of mitral stenosis.  6. Tricuspid valve regurgitation is moderate.  7. The aortic valve is normal in structure. Aortic valve regurgitation is moderate. No aortic stenosis is present.  8. The inferior vena cava is normal in size with greater than 50% respiratory variability, suggesting right atrial pressure of 3 mmHg. Conclusion(s)/Recommendation(s): Findings consistent with dilated cardiomyopathy. FINDINGS  Left Ventricle: Left ventricular ejection fraction, by estimation, is 25 to 30%. The left ventricle has severely decreased function. The left ventricle demonstrates global hypokinesis. The left ventricular internal cavity size was severely dilated. There is mild left ventricular hypertrophy. Left ventricular diastolic parameters are consistent with Grade II diastolic dysfunction (pseudonormalization). Right Ventricle: The right ventricular size is moderately enlarged. Mildly increased right ventricular wall thickness. Right ventricular systolic function is moderately reduced. Left Atrium: Left atrial size was severely dilated. Right Atrium: Right atrial size was severely dilated. Pericardium: There is no evidence of pericardial effusion. Mitral Valve: The mitral valve is normal in structure. Moderate mitral valve regurgitation. No evidence of mitral valve stenosis. Tricuspid Valve: The tricuspid valve is normal in structure. Tricuspid valve regurgitation is moderate . No evidence of tricuspid stenosis. Aortic Valve: The aortic valve is normal in structure. Aortic valve regurgitation is moderate. Aortic regurgitation PHT measures 345 msec. No aortic stenosis is present. Aortic valve peak gradient measures 5.0 mmHg. Pulmonic Valve: The pulmonic valve was normal in structure. Pulmonic valve regurgitation is trivial. No evidence of pulmonic stenosis. Aorta: The aortic root is normal in size and structure. Venous: The inferior vena cava is  normal in size with greater than 50% respiratory variability, suggesting right atrial pressure of 3  mmHg. IAS/Shunts: No atrial level shunt detected by color flow Doppler.  LEFT VENTRICLE PLAX 2D LVIDd:         5.20 cm      Diastology LVIDs:         4.60 cm      LV e' medial:    8.05 cm/s LV PW:         1.00 cm      LV E/e' medial:  9.6 LV IVS:        0.90 cm      LV e' lateral:   12.10 cm/s LVOT diam:     2.10 cm      LV E/e' lateral: 6.4 LV SV:         43 LV SV Index:   25 LVOT Area:     3.46 cm  LV Volumes (MOD) LV vol d, MOD A2C: 149.0 ml LV vol d, MOD A4C: 101.0 ml LV vol s, MOD A2C: 128.0 ml LV vol s, MOD A4C: 77.5 ml LV SV MOD A2C:     21.0 ml LV SV MOD A4C:     101.0 ml LV SV MOD BP:      25.9 ml RIGHT VENTRICLE            IVC RV S prime:     7.62 cm/s  IVC diam: 2.50 cm TAPSE (M-mode): 0.6 cm LEFT ATRIUM             Index        RIGHT ATRIUM           Index LA diam:        3.80 cm 2.19 cm/m   RA Area:     26.50 cm LA Vol (A2C):   54.2 ml 31.23 ml/m  RA Volume:   84.70 ml  48.80 ml/m LA Vol (A4C):   48.1 ml 27.71 ml/m LA Biplane Vol: 53.2 ml 30.65 ml/m  AORTIC VALVE                 PULMONIC VALVE AV Area (Vmax): 2.63 cm     PR End Diast Vel: 3.27 msec AV Vmax:        112.00 cm/s AV Peak Grad:   5.0 mmHg LVOT Vmax:      85.07 cm/s LVOT Vmean:     53.633 cm/s LVOT VTI:       0.124 m AI PHT:         345 msec  AORTA Ao Root diam: 3.60 cm Ao Asc diam:  3.50 cm MITRAL VALVE                  TRICUSPID VALVE MV Area (PHT): 3.40 cm       TR Peak grad:   24.6 mmHg MV Decel Time: 223 msec       TR Vmax:        248.00 cm/s MR Peak grad:    72.5 mmHg MR Mean grad:    58.0 mmHg    SHUNTS MR Vmax:         425.67 cm/s  Systemic VTI:  0.12 m MR Vmean:        363.0 cm/s   Systemic Diam: 2.10 cm MR PISA:         1.01 cm MR PISA Eff ROA: 9 mm MR PISA Radius:  0.40 cm MV E velocity: 77.00 cm/s Adrian Blackwater Electronically signed by Adrian Blackwater Signature Date/Time: 01/15/2023/8:14:37 AM    Final  CT Angio Chest PE  W and/or Wo Contrast  Result Date: 01/13/2023 CLINICAL DATA:  Pulmonary embolism (PE) suspected, high prob EXAM: CT ANGIOGRAPHY CHEST WITH CONTRAST TECHNIQUE: Multidetector CT imaging of the chest was performed using the standard protocol during bolus administration of intravenous contrast. Multiplanar CT image reconstructions and MIPs were obtained to evaluate the vascular anatomy. RADIATION DOSE REDUCTION: This exam was performed according to the departmental dose-optimization program which includes automated exposure control, adjustment of the mA and/or kV according to patient size and/or use of iterative reconstruction technique. CONTRAST:  75mL OMNIPAQUE IOHEXOL 350 MG/ML SOLN COMPARISON:  None Available. FINDINGS: Cardiovascular: Satisfactory opacification of the pulmonary arteries to the segmental level. No evidence of pulmonary embolism. The heart is enlarged. There is reflux of intravenous contrast into the hepatic veins, suggestive of right heart dysfunction. No pericardial effusion. Multivessel coronary artery calcifications. Atherosclerosis of the thoracic aorta. Mediastinum/Nodes: Prominent mediastinal lymph nodes, nonenlarged by size criteria. No hilar or axillary adenopathy. The trachea and esophagus demonstrate no significant findings. Lungs/Pleura: Small bilateral partially loculated pleural effusions. Fluid extending along the superior right major fissure. Bibasilar predominant interlobular septal thickening with patchy ground-glass changes. No pneumothorax. Upper Abdomen: Trace abdominal ascites. Calcified splenic granulomas. Musculoskeletal: Diffuse body wall edema. No acute osseous abnormality. Multilevel degenerative changes of the visualized spine, most pronounced at T1-T2 level with grade 1 anterolisthesis of T1 on T2. Review of the MIP images confirms the above findings. IMPRESSION: 1. No evidence of acute pulmonary embolism. 2. Cardiomegaly with pulmonary edema and small bilateral  partially loculated pleural effusions. 3. Reflux of intravenous contrast into the hepatic veins is suggestive of right heart dysfunction. 4. Diffuse body wall edema and trace abdominal ascites, compatible with third-spacing of fluid. Aortic Atherosclerosis (ICD10-I70.0). Electronically Signed   By: Hart Robinsons M.D.   On: 01/13/2023 15:59   DG Chest Port 1 View  Result Date: 01/13/2023 CLINICAL DATA:  Weakness.  Shortness of breath. EXAM: PORTABLE CHEST 1 VIEW COMPARISON:  Chest radiograph dated January 04, 2005. FINDINGS: Cardiomegaly. Pulmonary vascular congestion with bilateral basilar predominant interstitial opacities. Small layering bilateral pleural effusions. No pneumothorax. Severe degenerative changes of the bilateral shoulders. No acute osseous abnormality. IMPRESSION: Cardiomegaly with pulmonary edema and small bilateral pleural effusions. Electronically Signed   By: Hart Robinsons M.D.   On: 01/13/2023 15:41    Microbiology: Results for orders placed or performed during the hospital encounter of 01/13/23  SARS Coronavirus 2 by RT PCR (hospital order, performed in [facility] hospital lab) *cepheid single result test* Anterior Nasal Swab     Status: None   Collection Time: 01/13/23  1:20 PM   Specimen: Anterior Nasal Swab  Result Value Ref Range Status   SARS Coronavirus 2 by RT PCR NEGATIVE NEGATIVE Final    Comment: (NOTE) SARS-CoV-2 target nucleic acids are NOT DETECTED.  The SARS-CoV-2 RNA is generally detectable in upper and lower respiratory specimens during the acute phase of infection. The lowest concentration of SARS-CoV-2 viral copies this assay can detect is 250 copies / mL. A negative result does not preclude SARS-CoV-2 infection and should not be used as the sole basis for treatment or other patient management decisions.  A negative result may occur with improper specimen collection / handling, submission of specimen other than nasopharyngeal swab, presence of  viral mutation(s) within the areas targeted by this assay, and inadequate number of viral copies (<250 copies / mL). A negative result must be combined with clinical observations, patient history, and epidemiological  information.  Fact Sheet for Patients:   RoadLapTop.co.za  Fact Sheet for Healthcare Providers: http://kim-miller.com/  This test is not yet approved or  cleared by the [place] FDA and has been authorized for detection and/or diagnosis of SARS-CoV-2 by FDA under an Emergency Use Authorization (EUA).  This EUA will remain in effect (meaning this test can be used) for the duration of the COVID-19 declaration under Section 564(b)(1) of the Act, 21 U.S.C. section 360bbb-3(b)(1), unless the authorization is terminated or revoked sooner.  Performed at [facility], [address] [place], [place] [address]     Labs: CBC: Recent Labs  Lab 01/17/23 0504 01/17/23 0945 01/17/23 0952 01/18/23 0518 01/22/23 0514  WBC 7.0  --   --  7.7 13.9*  HGB 12.2 13.6 12.6 11.7* 12.5  HCT 39.0 40.0 37.0 37.7 39.7  MCV 77.8*  --   --  79.4* 78.3*  PLT 281  --   --  264 286   Basic Metabolic Panel: Recent Labs  Lab 01/17/23 0504 01/17/23 0945 01/18/23 0518 01/19/23 0418 01/20/23 0503 01/21/23 0522 01/21/23 1310 01/21/23 2043 01/22/23 0514 01/23/23 0432  NA 131*   < > 131*   < > 125* 122* 125* 123* 126* 128*  K 3.9   < > 4.1   < > 4.7 4.9 4.5  --  4.4 4.3  CL 95*  --  98   < > 87* 86* 87*  --  90* 92*  CO2 28  --  24   < > 26 26 24   --  25 26  GLUCOSE 100*  --  97   < > 108* 102* 140*  --  145* 139*  BUN 28*  --  24*   < > 32* 30* 31*  --  32* 36*  CREATININE 0.77  --  0.59   < > 0.84 1.07* 0.91  --  0.99 1.03*  CALCIUM 7.4*  --  7.8*   < > 8.5* 8.0* 8.3*  --  8.2* 8.1*  MG 2.2  --  2.2  --   --   --   --   --   --   --    < > = values in this interval not displayed.   Liver Function Tests: Recent Labs  Lab  01/21/23 1310  AST 24  ALT 10  ALKPHOS 85  BILITOT 1.3*  PROT 6.2*  ALBUMIN 3.1*   CBG: No results for input(s): "GLUCAP" in the last 168 hours.  Discharge time spent: greater than 30 minutes.  Signed: Alford Highland, MD [facility] 01/23/2023

## 2023-01-23 NOTE — Plan of Care (Signed)
Problem: Education: Goal: Knowledge of General Education information will improve Description: Including pain rating scale, medication(s)/side effects and non-pharmacologic comfort measures Outcome: Progressing   Problem: Health Behavior/Discharge Planning: Goal: Ability to manage health-related needs will improve Outcome: Progressing   Problem: Clinical Measurements: Goal: Ability to maintain clinical measurements within normal limits will improve Outcome: Progressing Goal: Will remain free from infection Outcome: Progressing Goal: Diagnostic test results will improve Outcome: Progressing Goal: Respiratory complications will improve Outcome: Progressing Goal: Cardiovascular complication will be avoided Outcome: Progressing   Problem: Activity: Goal: Risk for activity intolerance will decrease Outcome: Progressing   Problem: Nutrition: Goal: Adequate nutrition will be maintained Outcome: Progressing   Problem: Coping: Goal: Level of anxiety will decrease Outcome: Progressing   Problem: Elimination: Goal: Will not experience complications related to bowel motility Outcome: Progressing Goal: Will not experience complications related to urinary retention Outcome: Progressing   Problem: Pain Management: Goal: General experience of comfort will improve Outcome: Progressing   Problem: Safety: Goal: Ability to remain free from injury will improve Outcome: Progressing   Problem: Skin Integrity: Goal: Risk for impaired skin integrity will decrease Outcome: Progressing   Problem: Education: Goal: Understanding of CV disease, CV risk reduction, and recovery process will improve Outcome: Progressing Goal: Individualized Educational Video(s) Outcome: Progressing   Problem: Activity: Goal: Ability to return to baseline activity level will improve Outcome: Progressing   Problem: Cardiovascular: Goal: Ability to achieve and maintain adequate cardiovascular perfusion  will improve Outcome: Progressing Goal: Vascular access site(s) Level 0-1 will be maintained Outcome: Progressing   Problem: Health Behavior/Discharge Planning: Goal: Ability to safely manage health-related needs after discharge will improve Outcome: Progressing   Problem: Education: Goal: Ability to demonstrate management of disease process will improve Outcome: Progressing Goal: Ability to verbalize understanding of medication therapies will improve Outcome: Progressing Goal: Individualized Educational Video(s) Outcome: Progressing   Problem: Activity: Goal: Capacity to carry out activities will improve Outcome: Progressing   Problem: Cardiac: Goal: Ability to achieve and maintain adequate cardiopulmonary perfusion will improve Outcome: Progressing
